# Patient Record
Sex: Female | Born: 1965 | Race: Black or African American | Hispanic: No | Marital: Single | Smoking: Never smoker
Health system: Southern US, Community
[De-identification: ages and names within clinical notes are randomized; demographics above are authoritative.]

## PROBLEM LIST (undated history)

## (undated) DIAGNOSIS — C9200 Acute myeloblastic leukemia, not having achieved remission: Secondary | ICD-10-CM

---

## 1994-09-19 HISTORY — PX: TONSILLECTOMY: SUR1361

## 1998-02-09 ENCOUNTER — Other Ambulatory Visit: Admission: RE | Admit: 1998-02-09 | Discharge: 1998-02-09 | Payer: Self-pay | Admitting: *Deleted

## 1999-08-24 ENCOUNTER — Other Ambulatory Visit: Admission: RE | Admit: 1999-08-24 | Discharge: 1999-08-24 | Payer: Self-pay | Admitting: Family Medicine

## 2001-03-08 ENCOUNTER — Emergency Department (HOSPITAL_COMMUNITY): Admission: EM | Admit: 2001-03-08 | Discharge: 2001-03-09 | Payer: Self-pay | Admitting: *Deleted

## 2001-03-23 ENCOUNTER — Emergency Department (HOSPITAL_COMMUNITY): Admission: EM | Admit: 2001-03-23 | Discharge: 2001-03-23 | Payer: Self-pay | Admitting: Emergency Medicine

## 2001-03-25 ENCOUNTER — Emergency Department (HOSPITAL_COMMUNITY): Admission: EM | Admit: 2001-03-25 | Discharge: 2001-03-25 | Payer: Self-pay | Admitting: Emergency Medicine

## 2003-08-28 ENCOUNTER — Encounter: Admission: RE | Admit: 2003-08-28 | Discharge: 2003-09-25 | Payer: Self-pay | Admitting: Family Medicine

## 2003-08-29 ENCOUNTER — Ambulatory Visit (HOSPITAL_COMMUNITY): Admission: RE | Admit: 2003-08-29 | Discharge: 2003-08-29 | Payer: Self-pay | Admitting: Family Medicine

## 2003-09-20 HISTORY — PX: CHOLECYSTECTOMY: SHX55

## 2003-11-27 ENCOUNTER — Encounter: Admission: RE | Admit: 2003-11-27 | Discharge: 2003-11-27 | Payer: Self-pay | Admitting: General Surgery

## 2003-12-16 ENCOUNTER — Encounter: Admission: RE | Admit: 2003-12-16 | Discharge: 2003-12-16 | Payer: Self-pay | Admitting: General Surgery

## 2003-12-29 ENCOUNTER — Observation Stay (HOSPITAL_COMMUNITY): Admission: RE | Admit: 2003-12-29 | Discharge: 2003-12-30 | Payer: Self-pay | Admitting: General Surgery

## 2003-12-29 ENCOUNTER — Encounter (INDEPENDENT_AMBULATORY_CARE_PROVIDER_SITE_OTHER): Payer: Self-pay | Admitting: Specialist

## 2004-06-10 ENCOUNTER — Encounter: Admission: RE | Admit: 2004-06-10 | Discharge: 2004-06-10 | Payer: Self-pay | Admitting: Occupational Medicine

## 2004-11-09 ENCOUNTER — Emergency Department (HOSPITAL_COMMUNITY): Admission: EM | Admit: 2004-11-09 | Discharge: 2004-11-09 | Payer: Self-pay | Admitting: Family Medicine

## 2004-11-22 ENCOUNTER — Ambulatory Visit (HOSPITAL_COMMUNITY): Admission: RE | Admit: 2004-11-22 | Discharge: 2004-11-22 | Payer: Self-pay | Admitting: Obstetrics & Gynecology

## 2004-12-07 ENCOUNTER — Ambulatory Visit: Payer: Self-pay | Admitting: Gastroenterology

## 2004-12-08 ENCOUNTER — Ambulatory Visit: Payer: Self-pay | Admitting: Internal Medicine

## 2004-12-09 ENCOUNTER — Ambulatory Visit: Payer: Self-pay | Admitting: Hematology & Oncology

## 2004-12-09 ENCOUNTER — Inpatient Hospital Stay (HOSPITAL_COMMUNITY): Admission: AD | Admit: 2004-12-09 | Discharge: 2005-01-11 | Payer: Self-pay | Admitting: Internal Medicine

## 2004-12-10 ENCOUNTER — Encounter (INDEPENDENT_AMBULATORY_CARE_PROVIDER_SITE_OTHER): Payer: Self-pay | Admitting: Specialist

## 2004-12-10 ENCOUNTER — Encounter (INDEPENDENT_AMBULATORY_CARE_PROVIDER_SITE_OTHER): Payer: Self-pay | Admitting: Emergency Medicine

## 2004-12-28 ENCOUNTER — Encounter (INDEPENDENT_AMBULATORY_CARE_PROVIDER_SITE_OTHER): Payer: Self-pay | Admitting: Specialist

## 2005-01-08 ENCOUNTER — Ambulatory Visit: Payer: Self-pay | Admitting: Hematology & Oncology

## 2005-01-24 ENCOUNTER — Ambulatory Visit: Payer: Self-pay | Admitting: Internal Medicine

## 2005-01-31 ENCOUNTER — Encounter (INDEPENDENT_AMBULATORY_CARE_PROVIDER_SITE_OTHER): Payer: Self-pay | Admitting: Specialist

## 2005-01-31 ENCOUNTER — Encounter (INDEPENDENT_AMBULATORY_CARE_PROVIDER_SITE_OTHER): Payer: Self-pay | Admitting: Emergency Medicine

## 2005-01-31 ENCOUNTER — Inpatient Hospital Stay (HOSPITAL_COMMUNITY): Admission: AD | Admit: 2005-01-31 | Discharge: 2005-02-05 | Payer: Self-pay | Admitting: Internal Medicine

## 2005-02-21 ENCOUNTER — Ambulatory Visit: Payer: Self-pay | Admitting: Internal Medicine

## 2005-02-21 ENCOUNTER — Inpatient Hospital Stay (HOSPITAL_COMMUNITY): Admission: EM | Admit: 2005-02-21 | Discharge: 2005-02-25 | Payer: Self-pay | Admitting: Oncology

## 2005-02-21 ENCOUNTER — Encounter (HOSPITAL_COMMUNITY): Admission: RE | Admit: 2005-02-21 | Discharge: 2005-05-22 | Payer: Self-pay | Admitting: Oncology

## 2005-03-11 ENCOUNTER — Ambulatory Visit: Payer: Self-pay | Admitting: Internal Medicine

## 2005-03-14 ENCOUNTER — Inpatient Hospital Stay (HOSPITAL_COMMUNITY): Admission: EM | Admit: 2005-03-14 | Discharge: 2005-03-19 | Payer: Self-pay | Admitting: Internal Medicine

## 2005-03-28 ENCOUNTER — Inpatient Hospital Stay (HOSPITAL_COMMUNITY): Admission: EM | Admit: 2005-03-28 | Discharge: 2005-04-08 | Payer: Self-pay | Admitting: Internal Medicine

## 2005-03-29 ENCOUNTER — Ambulatory Visit: Payer: Self-pay | Admitting: Internal Medicine

## 2005-04-29 ENCOUNTER — Ambulatory Visit: Payer: Self-pay | Admitting: Internal Medicine

## 2005-05-09 ENCOUNTER — Inpatient Hospital Stay (HOSPITAL_COMMUNITY): Admission: RE | Admit: 2005-05-09 | Discharge: 2005-05-14 | Payer: Self-pay | Admitting: Internal Medicine

## 2005-05-10 ENCOUNTER — Ambulatory Visit: Payer: Self-pay | Admitting: Internal Medicine

## 2005-05-19 ENCOUNTER — Inpatient Hospital Stay (HOSPITAL_COMMUNITY): Admission: EM | Admit: 2005-05-19 | Discharge: 2005-05-30 | Payer: Self-pay | Admitting: Internal Medicine

## 2005-06-17 ENCOUNTER — Ambulatory Visit: Payer: Self-pay | Admitting: Internal Medicine

## 2005-06-20 ENCOUNTER — Encounter (HOSPITAL_COMMUNITY): Admission: RE | Admit: 2005-06-20 | Discharge: 2005-09-16 | Payer: Self-pay | Admitting: Internal Medicine

## 2005-06-27 ENCOUNTER — Inpatient Hospital Stay (HOSPITAL_COMMUNITY): Admission: EM | Admit: 2005-06-27 | Discharge: 2005-07-06 | Payer: Self-pay | Admitting: Emergency Medicine

## 2005-06-28 ENCOUNTER — Ambulatory Visit: Payer: Self-pay | Admitting: Internal Medicine

## 2005-07-13 ENCOUNTER — Inpatient Hospital Stay (HOSPITAL_COMMUNITY): Admission: AD | Admit: 2005-07-13 | Discharge: 2005-07-19 | Payer: Self-pay | Admitting: Internal Medicine

## 2005-07-15 ENCOUNTER — Encounter: Payer: Self-pay | Admitting: Internal Medicine

## 2005-07-15 ENCOUNTER — Encounter (INDEPENDENT_AMBULATORY_CARE_PROVIDER_SITE_OTHER): Payer: Self-pay | Admitting: *Deleted

## 2005-07-26 ENCOUNTER — Inpatient Hospital Stay (HOSPITAL_COMMUNITY): Admission: AD | Admit: 2005-07-26 | Discharge: 2005-08-09 | Payer: Self-pay | Admitting: Internal Medicine

## 2005-07-29 ENCOUNTER — Ambulatory Visit: Payer: Self-pay | Admitting: Internal Medicine

## 2005-08-15 ENCOUNTER — Ambulatory Visit: Payer: Self-pay | Admitting: Internal Medicine

## 2005-09-19 DIAGNOSIS — C9200 Acute myeloblastic leukemia, not having achieved remission: Secondary | ICD-10-CM

## 2005-09-19 HISTORY — DX: Acute myeloblastic leukemia, not having achieved remission: C92.00

## 2005-10-21 ENCOUNTER — Ambulatory Visit: Payer: Self-pay | Admitting: Internal Medicine

## 2005-12-19 ENCOUNTER — Ambulatory Visit: Payer: Self-pay | Admitting: Internal Medicine

## 2006-01-23 LAB — CBC WITH DIFFERENTIAL/PLATELET
Basophils Absolute: 0 10*3/uL (ref 0.0–0.1)
Eosinophils Absolute: 0 10*3/uL (ref 0.0–0.5)
HCT: 31.4 % — ABNORMAL LOW (ref 34.8–46.6)
HGB: 10.6 g/dL — ABNORMAL LOW (ref 11.6–15.9)
MONO#: 0.5 10*3/uL (ref 0.1–0.9)
NEUT%: 80.9 % — ABNORMAL HIGH (ref 39.6–76.8)
WBC: 7.6 10*3/uL (ref 3.9–10.0)
lymph#: 1 10*3/uL (ref 0.9–3.3)

## 2006-01-23 LAB — COMPREHENSIVE METABOLIC PANEL
ALT: 21 U/L (ref 0–40)
BUN: 7 mg/dL (ref 6–23)
CO2: 29 mEq/L (ref 19–32)
Calcium: 9.1 mg/dL (ref 8.4–10.5)
Chloride: 99 mEq/L (ref 96–112)
Creatinine, Ser: 0.5 mg/dL (ref 0.4–1.2)
Glucose, Bld: 114 mg/dL — ABNORMAL HIGH (ref 70–99)

## 2006-01-23 LAB — LACTATE DEHYDROGENASE: LDH: 162 U/L (ref 94–250)

## 2006-02-06 ENCOUNTER — Ambulatory Visit: Payer: Self-pay | Admitting: Internal Medicine

## 2006-02-06 LAB — CBC WITH DIFFERENTIAL/PLATELET
HCT: 28.2 % — ABNORMAL LOW (ref 34.8–46.6)
HGB: 9.7 g/dL — ABNORMAL LOW (ref 11.6–15.9)
MCH: 30.5 pg (ref 26.0–34.0)
RDW: 14.2 % (ref 11.3–14.5)

## 2006-02-06 LAB — MANUAL DIFFERENTIAL
ALC: 1.3 10*3/uL (ref 0.9–3.3)
ANC (CHCC manual diff): 3.5 10*3/uL (ref 1.5–6.5)
LYMPH: 19 % (ref 14–49)
MONO: 3 % (ref 0–14)
PLT EST: DECREASED

## 2006-02-07 ENCOUNTER — Ambulatory Visit (HOSPITAL_COMMUNITY): Admission: RE | Admit: 2006-02-07 | Discharge: 2006-02-07 | Payer: Self-pay | Admitting: Obstetrics & Gynecology

## 2006-02-10 ENCOUNTER — Inpatient Hospital Stay (HOSPITAL_COMMUNITY): Admission: RE | Admit: 2006-02-10 | Discharge: 2006-02-11 | Payer: Self-pay | Admitting: Internal Medicine

## 2006-02-10 ENCOUNTER — Ambulatory Visit: Payer: Self-pay | Admitting: Oncology

## 2006-02-10 ENCOUNTER — Encounter: Payer: Self-pay | Admitting: Internal Medicine

## 2006-02-10 ENCOUNTER — Encounter (INDEPENDENT_AMBULATORY_CARE_PROVIDER_SITE_OTHER): Payer: Self-pay | Admitting: *Deleted

## 2006-03-20 LAB — CBC WITH DIFFERENTIAL/PLATELET
BASO%: 0.4 % (ref 0.0–2.0)
HCT: 37.4 % (ref 34.8–46.6)
LYMPH%: 17 % (ref 14.0–48.0)
MCHC: 34.9 g/dL (ref 32.0–36.0)
MCV: 87.6 fL (ref 81.0–101.0)
MONO#: 0.6 10*3/uL (ref 0.1–0.9)
MONO%: 18.9 % — ABNORMAL HIGH (ref 0.0–13.0)
NEUT%: 63.4 % (ref 39.6–76.8)
Platelets: 152 10*3/uL (ref 145–400)
RBC: 4.27 10*6/uL (ref 3.70–5.32)

## 2006-03-20 LAB — COMPREHENSIVE METABOLIC PANEL
ALT: 378 U/L — ABNORMAL HIGH (ref 0–40)
Alkaline Phosphatase: 188 U/L — ABNORMAL HIGH (ref 39–117)
CO2: 25 mEq/L (ref 19–32)
Creatinine, Ser: 0.49 mg/dL (ref 0.40–1.20)
Glucose, Bld: 94 mg/dL (ref 70–99)
Sodium: 141 mEq/L (ref 135–145)
Total Bilirubin: 1 mg/dL (ref 0.3–1.2)
Total Protein: 7.3 g/dL (ref 6.0–8.3)

## 2006-03-23 LAB — COMPREHENSIVE METABOLIC PANEL
Alkaline Phosphatase: 162 U/L — ABNORMAL HIGH (ref 39–117)
CO2: 26 mEq/L (ref 19–32)
Creatinine, Ser: 0.47 mg/dL (ref 0.40–1.20)
Glucose, Bld: 92 mg/dL (ref 70–99)
Total Bilirubin: 0.7 mg/dL (ref 0.3–1.2)

## 2006-03-23 LAB — MAGNESIUM: Magnesium: 1.5 mg/dL (ref 1.5–2.5)

## 2006-03-23 LAB — CBC WITH DIFFERENTIAL/PLATELET
BASO%: 0.2 % (ref 0.0–2.0)
Eosinophils Absolute: 0 10*3/uL (ref 0.0–0.5)
HCT: 36.7 % (ref 34.8–46.6)
LYMPH%: 16.2 % (ref 14.0–48.0)
MCHC: 35.1 g/dL (ref 32.0–36.0)
MCV: 88 fL (ref 81.0–101.0)
MONO#: 0.5 10*3/uL (ref 0.1–0.9)
MONO%: 16.9 % — ABNORMAL HIGH (ref 0.0–13.0)
NEUT%: 66.1 % (ref 39.6–76.8)
Platelets: 135 10*3/uL — ABNORMAL LOW (ref 145–400)
WBC: 3.2 10*3/uL — ABNORMAL LOW (ref 3.9–10.0)

## 2006-05-08 ENCOUNTER — Ambulatory Visit: Payer: Self-pay | Admitting: Internal Medicine

## 2006-05-08 LAB — CBC WITH DIFFERENTIAL/PLATELET
Basophils Absolute: 0 10*3/uL (ref 0.0–0.1)
Eosinophils Absolute: 0 10*3/uL (ref 0.0–0.5)
HCT: 30.3 % — ABNORMAL LOW (ref 34.8–46.6)
HGB: 10.7 g/dL — ABNORMAL LOW (ref 11.6–15.9)
MONO#: 0.5 10*3/uL (ref 0.1–0.9)
NEUT%: 44.5 % (ref 39.6–76.8)
Platelets: 41 10*3/uL — ABNORMAL LOW (ref 145–400)
WBC: 2.2 10*3/uL — ABNORMAL LOW (ref 3.9–10.0)
lymph#: 0.7 10*3/uL — ABNORMAL LOW (ref 0.9–3.3)

## 2006-05-08 LAB — COMPREHENSIVE METABOLIC PANEL
ALT: 568 U/L — ABNORMAL HIGH (ref 0–40)
BUN: 11 mg/dL (ref 6–23)
CO2: 23 mEq/L (ref 19–32)
Calcium: 9.7 mg/dL (ref 8.4–10.5)
Chloride: 102 mEq/L (ref 96–112)
Creatinine, Ser: 0.6 mg/dL (ref 0.40–1.20)
Glucose, Bld: 117 mg/dL — ABNORMAL HIGH (ref 70–99)

## 2006-05-08 LAB — MAGNESIUM: Magnesium: 1.4 mg/dL — ABNORMAL LOW (ref 1.5–2.5)

## 2006-05-11 LAB — COMPREHENSIVE METABOLIC PANEL
ALT: 449 U/L — ABNORMAL HIGH (ref 0–40)
Alkaline Phosphatase: 150 U/L — ABNORMAL HIGH (ref 39–117)
Creatinine, Ser: 0.51 mg/dL (ref 0.40–1.20)
Glucose, Bld: 95 mg/dL (ref 70–99)
Sodium: 137 mEq/L (ref 135–145)
Total Bilirubin: 1.9 mg/dL — ABNORMAL HIGH (ref 0.3–1.2)
Total Protein: 7.8 g/dL (ref 6.0–8.3)

## 2006-05-11 LAB — CBC WITH DIFFERENTIAL/PLATELET
Basophils Absolute: 0 10*3/uL (ref 0.0–0.1)
EOS%: 0 % (ref 0.0–7.0)
HGB: 9.6 g/dL — ABNORMAL LOW (ref 11.6–15.9)
MCH: 29.4 pg (ref 26.0–34.0)
MONO#: 0.5 10*3/uL (ref 0.1–0.9)
NEUT#: 2 10*3/uL (ref 1.5–6.5)
RDW: 14.7 % — ABNORMAL HIGH (ref 11.3–14.5)
WBC: 3.2 10*3/uL — ABNORMAL LOW (ref 3.9–10.0)
lymph#: 0.7 10*3/uL — ABNORMAL LOW (ref 0.9–3.3)

## 2006-05-18 LAB — CBC WITH DIFFERENTIAL/PLATELET
Eosinophils Absolute: 0 10*3/uL (ref 0.0–0.5)
HCT: 24 % — ABNORMAL LOW (ref 34.8–46.6)
LYMPH%: 21.1 % (ref 14.0–48.0)
MCV: 84.3 fL (ref 81.0–101.0)
MONO#: 0.4 10*3/uL (ref 0.1–0.9)
MONO%: 16.4 % — ABNORMAL HIGH (ref 0.0–13.0)
NEUT#: 1.6 10*3/uL (ref 1.5–6.5)
NEUT%: 61.6 % (ref 39.6–76.8)
Platelets: 71 10*3/uL — ABNORMAL LOW (ref 145–400)
RBC: 2.85 10*6/uL — ABNORMAL LOW (ref 3.70–5.32)

## 2006-05-18 LAB — COMPREHENSIVE METABOLIC PANEL
BUN: 7 mg/dL (ref 6–23)
CO2: 24 mEq/L (ref 19–32)
Calcium: 9 mg/dL (ref 8.4–10.5)
Chloride: 101 mEq/L (ref 96–112)
Creatinine, Ser: 0.51 mg/dL (ref 0.40–1.20)
Glucose, Bld: 99 mg/dL (ref 70–99)
Total Bilirubin: 2.3 mg/dL — ABNORMAL HIGH (ref 0.3–1.2)

## 2006-05-18 LAB — TECHNOLOGIST REVIEW: Technologist Review: 2

## 2006-05-23 LAB — CBC WITH DIFFERENTIAL/PLATELET
BASO%: 0.3 % (ref 0.0–2.0)
Eosinophils Absolute: 0 10*3/uL (ref 0.0–0.5)
HCT: 23.1 % — ABNORMAL LOW (ref 34.8–46.6)
HGB: 8.1 g/dL — ABNORMAL LOW (ref 11.6–15.9)
LYMPH%: 14.4 % (ref 14.0–48.0)
MCHC: 35.1 g/dL (ref 32.0–36.0)
MONO#: 0.3 10*3/uL (ref 0.1–0.9)
NEUT#: 2.3 10*3/uL (ref 1.5–6.5)
NEUT%: 73.4 % (ref 39.6–76.8)
Platelets: 81 10*3/uL — ABNORMAL LOW (ref 145–400)
WBC: 3.1 10*3/uL — ABNORMAL LOW (ref 3.9–10.0)
lymph#: 0.4 10*3/uL — ABNORMAL LOW (ref 0.9–3.3)

## 2006-05-25 LAB — CBC WITH DIFFERENTIAL/PLATELET
Eosinophils Absolute: 0.1 10*3/uL (ref 0.0–0.5)
MCV: 87.4 fL (ref 81.0–101.0)
MONO#: 0.5 10*3/uL (ref 0.1–0.9)
MONO%: 12.3 % (ref 0.0–13.0)
NEUT#: 2.6 10*3/uL (ref 1.5–6.5)
RBC: 2.88 10*6/uL — ABNORMAL LOW (ref 3.70–5.32)
RDW: 19.6 % — ABNORMAL HIGH (ref 11.3–14.5)
WBC: 3.8 10*3/uL — ABNORMAL LOW (ref 3.9–10.0)

## 2006-05-25 LAB — COMPREHENSIVE METABOLIC PANEL
ALT: 225 U/L — ABNORMAL HIGH (ref 0–40)
AST: 119 U/L — ABNORMAL HIGH (ref 0–37)
CO2: 23 mEq/L (ref 19–32)
Sodium: 137 mEq/L (ref 135–145)
Total Bilirubin: 1.5 mg/dL — ABNORMAL HIGH (ref 0.3–1.2)
Total Protein: 7.3 g/dL (ref 6.0–8.3)

## 2006-05-25 LAB — HOLD TUBE, BLOOD BANK

## 2006-05-29 ENCOUNTER — Encounter (HOSPITAL_COMMUNITY): Admission: RE | Admit: 2006-05-29 | Discharge: 2006-06-14 | Payer: Self-pay | Admitting: Internal Medicine

## 2006-05-29 LAB — COMPREHENSIVE METABOLIC PANEL
ALT: 117 U/L — ABNORMAL HIGH (ref 0–40)
Albumin: 3.7 g/dL (ref 3.5–5.2)
Alkaline Phosphatase: 89 U/L (ref 39–117)
CO2: 26 mEq/L (ref 19–32)
Glucose, Bld: 107 mg/dL — ABNORMAL HIGH (ref 70–99)
Potassium: 3.2 mEq/L — ABNORMAL LOW (ref 3.5–5.3)
Sodium: 138 mEq/L (ref 135–145)
Total Protein: 6.7 g/dL (ref 6.0–8.3)

## 2006-05-29 LAB — CBC WITH DIFFERENTIAL/PLATELET
BASO%: 0.3 % (ref 0.0–2.0)
EOS%: 1.5 % (ref 0.0–7.0)
LYMPH%: 23 % (ref 14.0–48.0)
MCH: 31.4 pg (ref 26.0–34.0)
MCHC: 34.9 g/dL (ref 32.0–36.0)
MCV: 89.8 fL (ref 81.0–101.0)
MONO#: 0.4 10*3/uL (ref 0.1–0.9)
MONO%: 12.9 % (ref 0.0–13.0)
Platelets: 98 10*3/uL — ABNORMAL LOW (ref 145–400)
RBC: 2.52 10*6/uL — ABNORMAL LOW (ref 3.70–5.32)
WBC: 3 10*3/uL — ABNORMAL LOW (ref 3.9–10.0)

## 2006-05-30 LAB — TYPE & CROSSMATCH - CHCC

## 2006-06-01 LAB — CBC WITH DIFFERENTIAL/PLATELET
BASO%: 0.4 % (ref 0.0–2.0)
EOS%: 1.8 % (ref 0.0–7.0)
HCT: 33 % — ABNORMAL LOW (ref 34.8–46.6)
LYMPH%: 22.9 % (ref 14.0–48.0)
MCH: 31 pg (ref 26.0–34.0)
MCHC: 34.1 g/dL (ref 32.0–36.0)
MONO#: 0.6 10*3/uL (ref 0.1–0.9)
MONO%: 12.1 % (ref 0.0–13.0)
NEUT%: 62.8 % (ref 39.6–76.8)
Platelets: 102 10*3/uL — ABNORMAL LOW (ref 145–400)
RBC: 3.62 10*6/uL — ABNORMAL LOW (ref 3.70–5.32)
WBC: 4.8 10*3/uL (ref 3.9–10.0)

## 2006-06-01 LAB — COMPREHENSIVE METABOLIC PANEL
ALT: 72 U/L — ABNORMAL HIGH (ref 0–40)
AST: 36 U/L (ref 0–37)
Alkaline Phosphatase: 90 U/L (ref 39–117)
CO2: 23 mEq/L (ref 19–32)
Creatinine, Ser: 0.54 mg/dL (ref 0.40–1.20)
Sodium: 137 mEq/L (ref 135–145)
Total Bilirubin: 1.1 mg/dL (ref 0.3–1.2)
Total Protein: 6.7 g/dL (ref 6.0–8.3)

## 2006-07-02 ENCOUNTER — Inpatient Hospital Stay (HOSPITAL_COMMUNITY): Admission: EM | Admit: 2006-07-02 | Discharge: 2006-07-15 | Payer: Self-pay | Admitting: Emergency Medicine

## 2006-07-02 ENCOUNTER — Ambulatory Visit: Payer: Self-pay | Admitting: Emergency Medicine

## 2006-07-03 ENCOUNTER — Ambulatory Visit: Payer: Self-pay | Admitting: Internal Medicine

## 2006-07-09 ENCOUNTER — Encounter (INDEPENDENT_AMBULATORY_CARE_PROVIDER_SITE_OTHER): Payer: Self-pay | Admitting: Specialist

## 2006-12-31 IMAGING — CT CT CHEST W/ CM
1 of 2 series · 14 of 33 positions shown, 18 images · IV contrast (omnipaque)
Comparison: CTs of the chest and abdomen 12/10/2004 and chest radiographs done today.

CLINICAL DATA: Leukemia post chemotherapy.  Right flank pain.  Evaluate for possible pyelonephritis, pneumonia, or appendicitis.  
CHEST CT WITH CONTRAST:
TECHNIQUE: Multidetector CT imaging of the chest was performed following the standard protocol during bolus administration of intravenous contrast.
Contrast:  125 cc Omnipaque 300.  Oral contrast was given.
TECHNIQUE: Multidetector CT imaging of the abdomen was performed following the standard protocol during bolus administration of intravenous contrast.
TECHNIQUE: Multidetector CT imaging of the pelvis was performed following the standard protocol during bolus administration of intravenous contrast.

[Series 2: cap 5.0 b40f st · axial · 0.79mm/px · z∈[-504,+26]mm · 14 of 118 slices shown, 18 images]
[im 6/118  mediastinal]
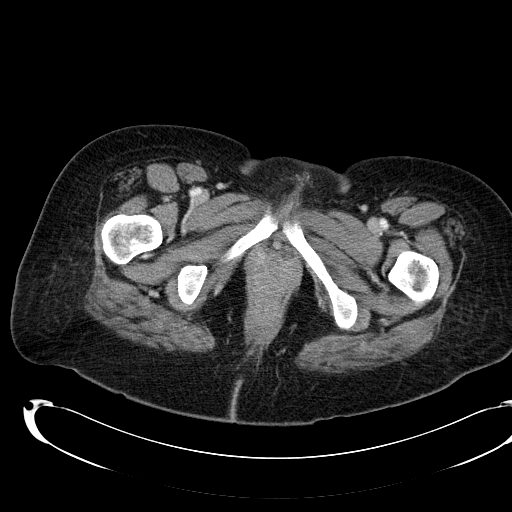
[im 6/118  lung]
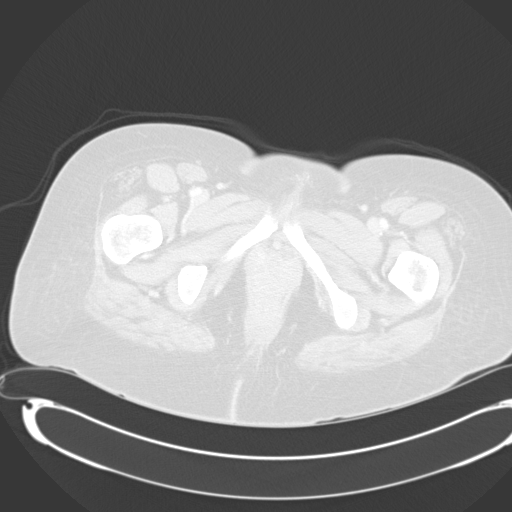
[im 17/118  lung]
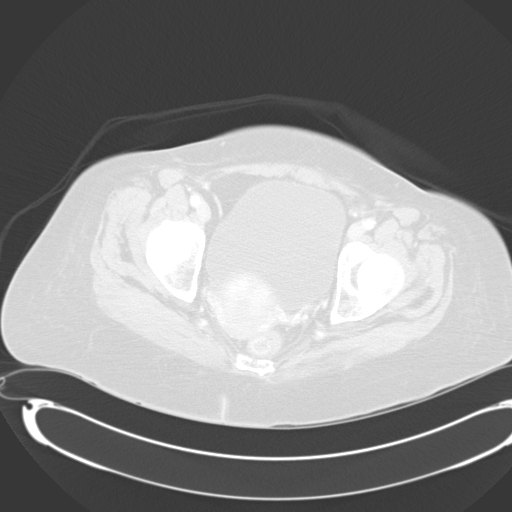
[im 28/118  lung]
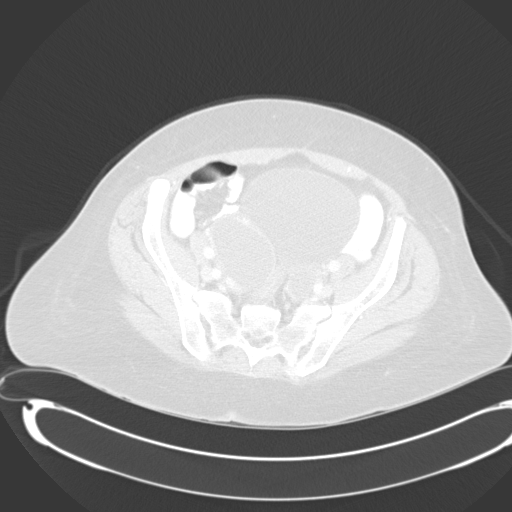
[im 34/118  lung]
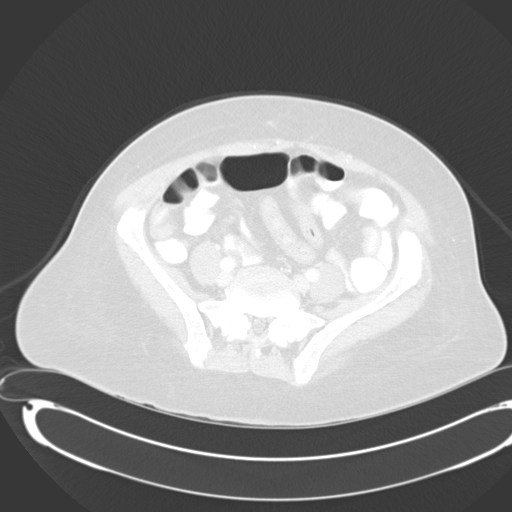
[im 40/118  mediastinal]
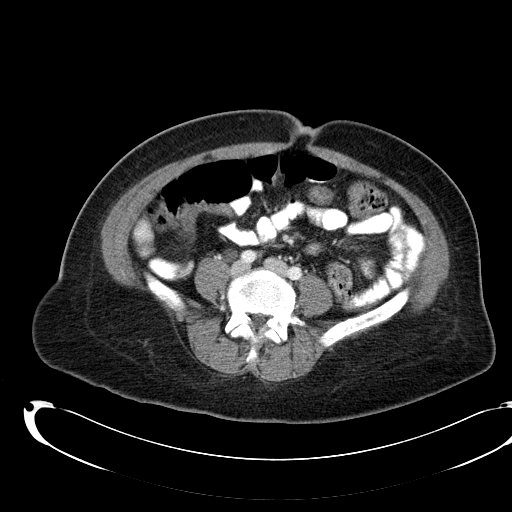
[im 40/118  lung]
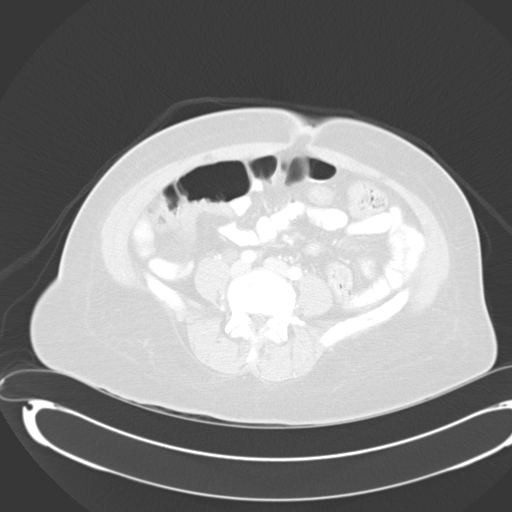
[im 51/118  lung]
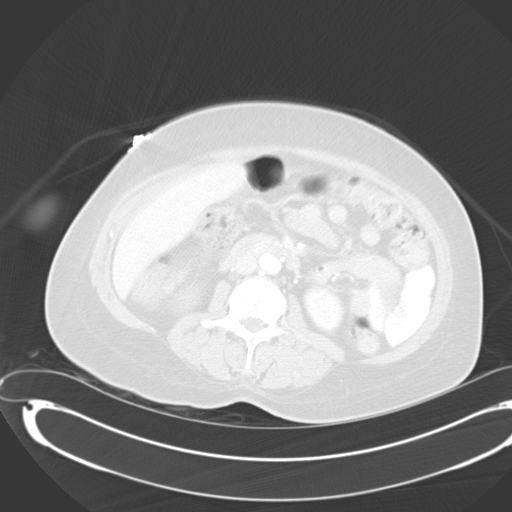
[im 58/118  lung]
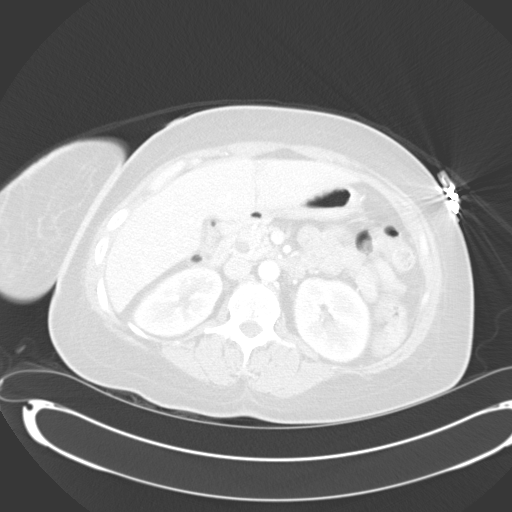
[im 59/118  lung]
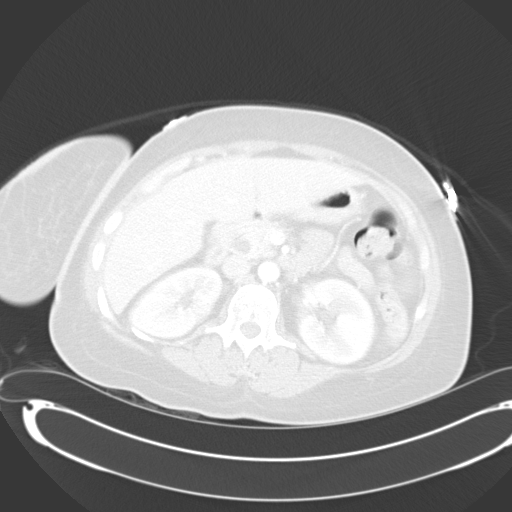
[im 67/118  mediastinal]
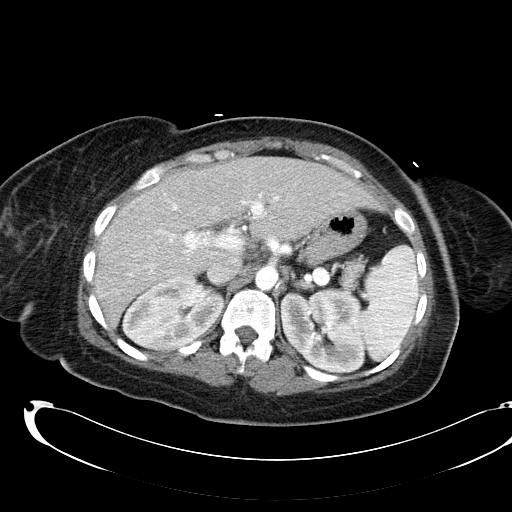
[im 67/118  lung]
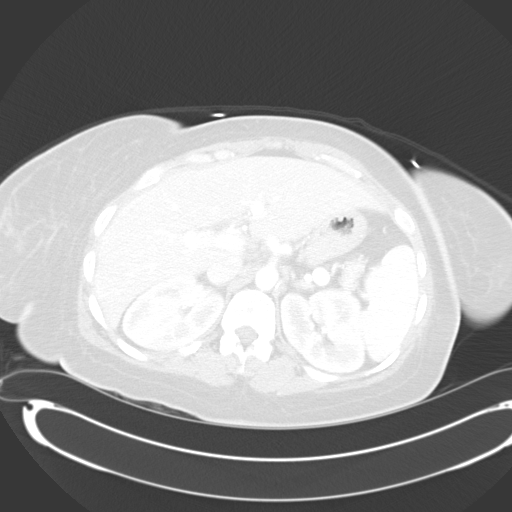
[im 79/118  lung]
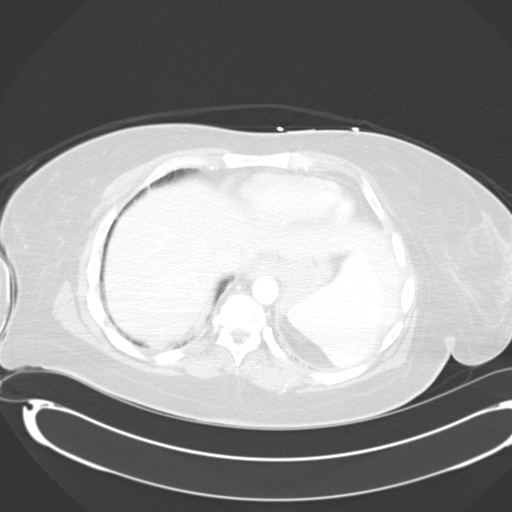
[im 88/118  lung]
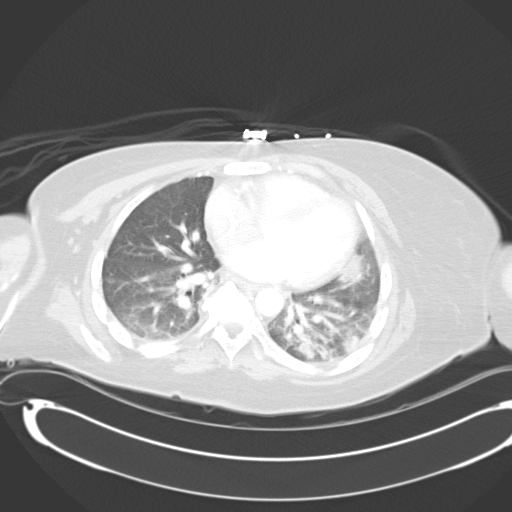
[im 90/118  lung]
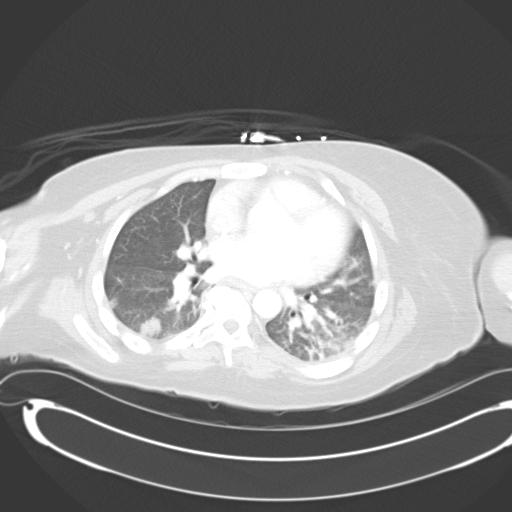
[im 101/118  mediastinal]
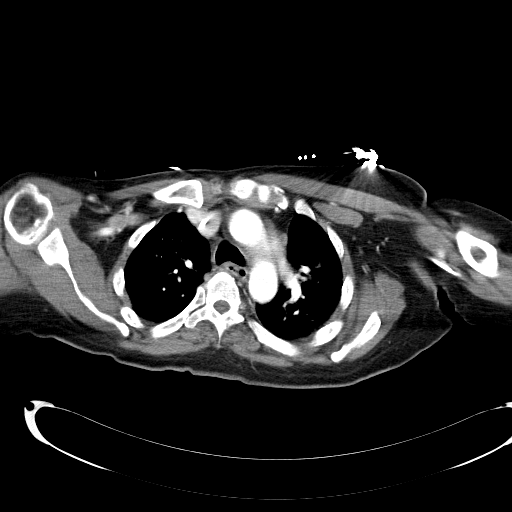
[im 101/118  lung]
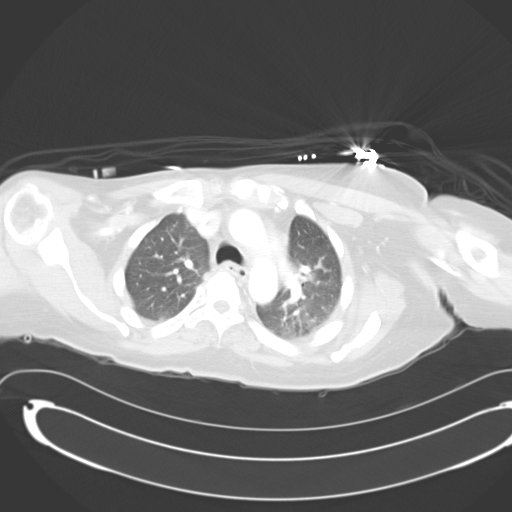
[im 112/118  lung]
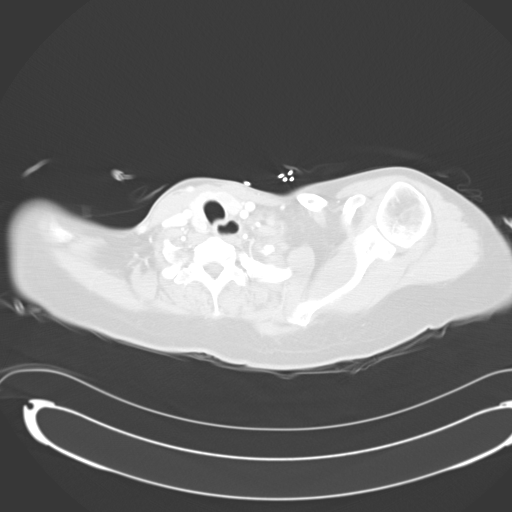

[14 of 33 positions shown; findings below may reference images not displayed]

FINDINGS: There is multifocal airspace disease with nodular components in the left upper lobe on image 14 and in the superior segment of the right lower lobe on image 29.  Additional patchy components are present in both lung bases.  There is no well-defined lung mass or endobronchial lesion.  There is no pleural or pericardial effusion.  No enlarged mediastinal or hilar lymph nodes are seen.  There is no axillary adenopathy.  A PICC line extends into the SVC.  Although not specifically designed to evaluate for pulmonary embolism, there is no evidence for that on this examination.
IMPRESSION: New bilateral airspace opacities with nodular components suspicious for pneumonia.  This could be due to septic emboli.  Drug reaction and leukemic infiltrates are less likely considerations.  Clinical correlation and radiographic followup are recommended.  
ABDOMEN CT WITH CONTRAST:
FINDINGS: The bowel contrast opacification is not optimal.  I do not see any obvious bowel wall thickening or focal inflammatory process in the abdomen.  
The liver has a stable appearance status post cholecystectomy.  There is mild dilatation in the common bile duct.  No pancreatic mass or pancreatic ductal dilatation is seen.  The spleen remains normal in size and demonstrates no focal abnormality.  The adrenal glands and kidneys appear normal.
IMPRESSION: 1.  No CT evidence of pyelonephritis or acute appendicitis.  The appendix is not clearly seen, however.
2.  Mildly progressive biliary dilatation status post cholecystectomy.  This may be physiologic.  Correlation with liver function tests is recommended.  
PELVIS CT WITH CONTRAST:
FINDINGS: In the right adnexa, there is a mildly septated low density lesion measuring 4.0 x 6.2 cm transverse on image 91.  There is a small low density structure in the left adnexa, measuring up to 2.5 cm in diameter.  These are probably cysts of the ovaries.  I do not see a definite dilated fallopian tube or surrounding inflammatory change.  The urinary bladder is distended.  The uterus appears unremarkable.  The appendix is not clearly seen, although no definite signs of appendicitis are demonstrated.  The pelvis was not imaged on the prior examination.
IMPRESSION: 1.  eptated cystic lesions in both adnexa, probably ovarian cysts.  The one on the right could contribute to the patient?s flank pain.  Correlate clinically.  Ultrasound followup is recommended.  
2.  No definite signs of acute inflammation.  The appendix is not clearly seen.

## 2006-12-31 IMAGING — CR DG CHEST 1V PORT
1 series · 1 of 1 positions shown · non-contrast
Comparison: 07/01/06

CLINICAL DATA: 40 year-old with pyelonephritis.  Evaluate PICC line placement.  
PORTABLE CHEST- 1 VIEW:

[view not recorded]
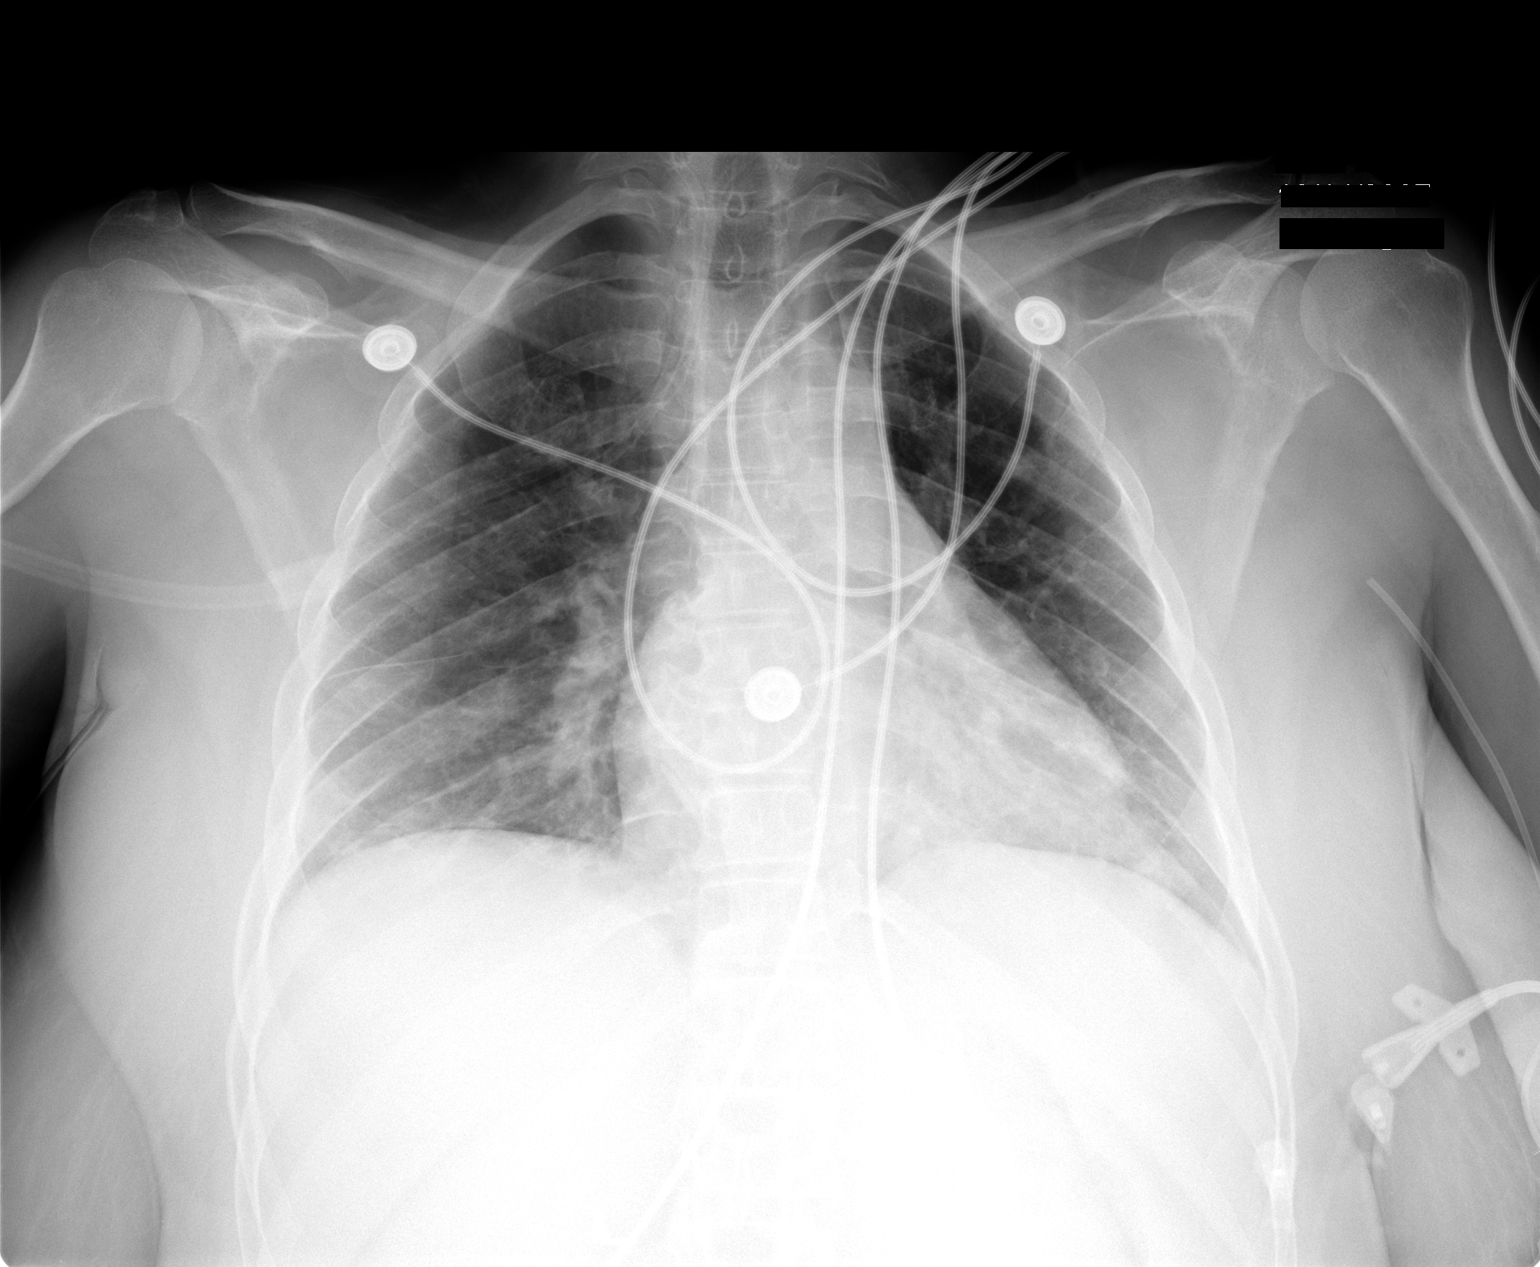

[1 of 1 positions shown; findings below may reference images not displayed]

FINDINGS: Single view of the chest demonstrates that the left PICC line has been pulled back and the tip is in the left arm near the axilla.  Compared to the prior exam, the aeration in the left lung base is slightly improved but there probably is a component of basilar atelectasis and mild thickening in the central airways.  Heart and mediastinum are relatively stable.
IMPRESSION: 1.  The PICC line has been pulled back as described.
2.  Mild central airway thickening with slightly improved aeration in the left lung base.

## 2010-03-15 ENCOUNTER — Ambulatory Visit (HOSPITAL_COMMUNITY): Admission: RE | Admit: 2010-03-15 | Discharge: 2010-03-15 | Payer: Self-pay | Admitting: Obstetrics & Gynecology

## 2011-02-04 NOTE — Discharge Summary (Signed)
NAME:  ROBERTO, HLAVATY NO.:  [id number]   MEDICAL RECORD NO.:  [id number]          PATIENT TYPE:  INP   LOCATION:  1303                         FACILITY:  [facility]   PHYSICIAN:  Lajuana Matte, MD  DATE OF BIRTH:  07-12-1966   DATE OF ADMISSION:  07/13/2005  DATE OF DISCHARGE:  07/19/2005                                 DISCHARGE SUMMARY   REASON FOR ADMISSION:  Ms. 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  She is status post induction chemotherapy with  idarubicin and cytarabine given on December 15, 2004, without evidence of  leukemia on bone marrow biopsy performed on day #14 and day #48.  She is  status post three cycles of consolidation therapy given on Jan 31, 2005,  March 14, 2005 and on May 09, 2005.  She was admitted after cycle #2 and  cycle #3 for severe neutropenia/pancytopenia.   HOSPITAL COURSE:  The patient was admitted to the oncology service.  Planned  procedures of a Hickman catheter placement was obtained on the day of  admission.  On July 15, 2005, the bone marrow biopsy and aspirate were  performed.  The patient tolerated the procedure without any difficulty or  complications.  She received her chemotherapy as scheduled.  During her most  recent admission, she had vancomycin-resistant enterococcus that had been  treated with Zyvox.  During this admission, VRE precautions were put in  place.  There were no further positive cultures for VRE during this  admission.  The patient did, however, become anemic with a hemoglobin of  7.7.  She was transfused 2 units of packed red blood cells.  As stated  above, she completed her fourth cycle of consolidation chemotherapy with  high-dose Ara-C.   CONDITION ON  DISCHARGE:  Good.   DISCHARGE MEDICATIONS:  1.  Protonix 40 mg p.o. daily.  2.  Dexamethasone 0.1% eye drops one drop in each eye x48 hours.  3.  Diflucan 100 mg p.o. daily.  4.  Avelox 400 mg p.o. daily.  5.  Oxycodone 5 mg q.6h. p.r.n. pain.  6.  K-Dur 10 mEq p.o. daily.   DISPOSITION:  Discharged to home.   SPECIAL INSTRUCTIONS:  She will have labs drawn on July 25, 2005, at  [facility].   FOLLOW UP:  Follow up with Dr. Lajuana Matte as scheduled.     ______________________________  [name], PA.      Lajuana Matte, MD  Electronically Signed    AJ/MEDQ  D:  07/19/2005  T:  07/19/2005  Job:  [id number]

## 2011-02-04 NOTE — H&P (Signed)
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 This was found during  workup for abdominal pain. She is now status post induction and cycle #2 of  consolidation chemotherapy. She is seen in the office today after blood work  showed platelets less than 6000 and an H&H revealing anemia. Elizabeth [name] started  bleeding from her gums just before we completed the paperwork for admission  to the hospital. Although she is currently afebrile, she is admitted for  thrombocytopenia, severe neutropenia, and coverage with IV antibiotics,  antivirals, and antiyeast agents will be put into place. Elizabeth [name] has a  history of GERD and depression. Her mother is quite attentive and we will  admit her for platelet transfusion ASAP.   PAST MEDICAL HISTORY:  1.  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  2.  History of depression.  3.  History of peptic ulcer disease.  4.  Status post cholecystectomy.  5.  Status post tonsillectomy and adenoidectomy.   FAMILY HISTORY:  Positive for diabetes mellitus, hypertension, and coronary  artery disease. There is also family history of pancreatic cancer and breast  cancer. She had a brother to die at age 42 of congestive heart failure and  another sister died in her early 9s of a heart attack.   Elizabeth [name] is single, without children. She used to work at the [employer] in [employer]. She denies any history of smoking, alcohol, or  illicit drug abuse.   ALLERGIES:  She has no known drug allergies. She does have an  intolerance to  ASPIRIN.   HOME MEDICATIONS:  Include Protonix and OxyIR.   REVIEW OF SYSTEMS:  She notes that her bleeding started within the last  hour. She has had no nausea or abdominal pain. She has had some weakness. No  notable shortness of breath. No blurred vision, no chest pain. She noticed  no change in her stool including no blood in the stool. No swelling of the  extremities.   PHYSICAL EXAMINATION:  GENERAL:  This is a young African-American female who  appears much younger than her stated age.  HEENT:  Sclerae are anicteric. She does have some deviation of the right  eye. There are no obvious plaque or lesions in the oropharynx. There is  notable oozing of blood. There are petechial lesions in the posterior  oropharynx. The teeth are in severe need of repair.  CHEST:  Clear to auscultation.  CARDIOVASCULAR:  Regular rate and rhythm. No murmur, no gallop.  ABDOMEN:  Soft, nontender, no organomegaly.  EXTREMITIES:  Without cyanosis, clubbing, or edema.  VITAL SIGNS:  Blood pressure  is 131/75, pulse is 136, respirations 20,  temperature was 98.8. Weight was 175 pounds.   ADMISSION LABORATORY DATA:  WBC 0.4, ANC 0.0, hemoglobin 8.4, hematocrit  23.0.   IMPRESSION AND PLAN:  We will go ahead and admit Elizabeth [name] for her  neutropenia. We will also plan to transfuse platelets as well as packed red  blood cells. Will also start hydration with normal saline and make sure that  she is placed on IV antibiotics of cefepime, acyclovir, and Diflucan, as  well as neutropenic precautions. We will plan to follow up with Dr. Arbutus Ped.  The patient was seen and examined by Dr. Arbutus Ped prior to admission.       JB/MEDQ  D:  03/29/2005  T:  03/29/2005  Job:  [id number]

## 2011-02-04 NOTE — H&P (Signed)
NAME:  Elizabeth [name], Elizabeth [name] NO.:  [id number]   MEDICAL RECORD NO.:  [id number]          PATIENT TYPE:  INP   LOCATION:  1306                         [facility]ACILITY:  [facility]   PHYSICIAN:  Lajuana Matte, MD  DATE OF BIRTH:  1966/04/19   DATE OF ADMISSION:  05/09/2005  DATE OF DISCHARGE:                                HISTORY & PHYSICAL   REASON FOR ADMISSION:  45 year old white female with acute myeloid leukemia  admitted for third cycle of consolidation chemotherapy with high dose  cytarabine.   HISTORY OF PRESENT ILLNESS:  Elizabeth [name] is a very pleasant 45 year old  African-American female who was diagnosed with acute myeloid leukemia with  normal cytogenetics in March of 2006.  The patient is status post induction  chemotherapy with idarubicin and cytarabine given on December 15, 2004 with no  evidence of leukemia on bone marrow biopsies performed on day 14 and day 48  after the induction.  The patient is also status post two cycles of  consolidation chemotherapy last dose given on March 14, 2005.  This was  complicated by an episode of neutropenic fever and pancytopenia which was  recently resolved and the patient started to have normal counts.  The  patient was also referred to [facility] and evaluated by Dr.  Greggory Stallion for stem cell transplant after completing the course of the  consolidation chemotherapy.  She already has had HLA typing performed and  her other siblings are in the process of the HLA typing.  She was admitted  today for the third cycle of high dose cytarabine today.  She denied having  any significant complaints except for mild right lower quadrant pain that  resolved spontaneously.  Otherwise, she is feeling fine.   REVIEW OF SYSTEMS:  She has no fever, chills, headache, blurring of vision,  double vision.  No chest pain, shortness of breath, cough or palpitations.  No nausea or vomiting, abdominal pain, diarrhea, constipation, melena or  hematochezia.  No dysuria, hematuria, urgency or increased frequency.   PAST MEDICAL HISTORY:  1.  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  2.  Gastroesophageal reflux disease.  3.  Status post cholecystectomy.  4.  History of depression.  5.  Status post tonsillectomy and adenoidectomy.   FAMILY HISTORY:  Significant for diabetes mellitus, hypertension and  coronary artery disease.  She had a brother who died at age 52 of congestive  heart failure and another sister died in her early 31's with a heart attack.   SOCIAL HISTORY:  She is single. She has no children.  She is currently on  disability.  Denied having any history of smoking, alcohol or drug abuse.   ALLERGIES:  She has intolerance to ASPIRIN.   MEDICATIONS:  Her home medications include only Protonix and oxycodone.   PHYSICAL EXAMINATION:  VITAL SIGNS:  Blood pressure 107/67, pulse 92,  respiratory rate 20, temperature 99.7.  Oxygen saturation is 100% on room  air.  GENERAL APPEARANCE:  Very pleasant 45 year old African-American female  awake, alert and in no acute distress.  HEENT:  Normocephalic, atraumatic.  Clear oropharynx.  NECK:  Examination soft with no lymphadenopathy.  CHEST:  Clear to auscultation.  No wheezes, crackles or dullness to  percussion.  CARDIOVASCULAR:  Normal S1 and S2, regular rate and rhythm.  No murmurs,  rubs or gallops.  ABDOMEN:  Soft, nontender, nondistended, no masses.  EXTREMITIES:  Show no edema.   LABORATORY DATA:  Performed at the [facility] showed white blood  cell count 4.7, hemoglobin 10.0, hematocrit 29.4, platelet count 133,000.  Sodium 140, potassium 3.6, creatinine 0.6, BUN 10.  Glucose 92.  Alkaline  phosphatase 86, AST 18, ALT 30, calcium 9.0, albumin 3.9.   ASSESSMENT:  This is a 45 year old African-American female with history of  acute myeloid leukemia.  She is status post induction two cycles  consolidation chemotherapy with high  dose cytarabine.   PLAN:  I will admit the patient today to [facility] oncology  service for her third cycle of consolidation chemotherapy.  This cycle will  be in the form of cytarabine 2 grams/m2 given every 12 hours on days 1, 3  and 5 of her admission.  The patient also will be started on dexamethasone  0.1% eye drops during the chemotherapy.  She will continue on  gastrointestinal prophylaxis with Protonix 20 mg p.o. daily as well as deep  venous thrombosis prophylaxis with sequential compression devices.  The  patient also will have oxycodone for pain management if needed.      Lajuana Matte, MD  Electronically Signed     MKM/MEDQ  D:  05/10/2005  T:  05/10/2005  Job:  [id number]   cc:   Jaclyn Prime. Greggory Stallion, M.D.  [facility]  [place] [address]  Fax: [phone]

## 2011-02-04 NOTE — Discharge Summary (Signed)
NAME:  Elizabeth [name], Elizabeth [name] NO.:  [id number]   MEDICAL RECORD NO.:  [id number]          PATIENT TYPE:  INP   LOCATION:  0251                         FACILITY:  [facility]   PHYSICIAN:  Lajuana Matte, MD  DATE OF BIRTH:  09-19-1966   DATE OF ADMISSION:  12/09/2004  DATE OF DISCHARGE:  01/11/2005                                 DISCHARGE SUMMARY   CONDITION ON DISCHARGE:  Improved.   DISCHARGE DIAGNOSES:  1.  Acute myelogenous leukemia, day 28 status post induction chemotherapy      with Idarubicin and cytarabine.  2.  Pancytopenia secondary to #1, improved.  3.  Elevated liver function tests, possibly due to liver dysfunction      secondary to chemotherapy versus antibiotics, to be monitored as an      outpatient.  4.  Headache, resolved.  5.  Ejection fraction 58%.   MEDICATIONS ON DISCHARGE:  1.  Oxycodone 5 mg p.o. q.4h. p.r.n. pain.  2.  Compazine 10 mg p.o. q.6h. p.r.n. nausea and vomiting.  3.  Protonix 40 mg p.o. daily.   LABORATORY ON DISCHARGE:  Hemoglobin 10.2, hematocrit 29.8, white count 3.1,  platelet count 132,000. ANC 1000. Sodium 137, potassium 3.7, BUN 5,  creatinine 0.4, glucose 117, alkaline phosphatase 270, AST 84, ALT 134. CMV  negative.   PROCEDURES:  1.  Status post neck CT with contrast/chest CT with contrast/abdominal CT      with contrast, all on December 11, 2003.  2.  Status post cardiac MUGA, December 10, 2004.  3.  Status post Hickman catheter placement, December 14, 2004; interventional      radiology.  4.  Status post abdominal ultrasound on January 10, 2005.  5.  CT of the head on December 26, 2004.  6.  Status post bone marrow biopsy, January 10, 2005.  7.  Status post transfusion of platelets for a platelet count of 13,000 on      January 04, 2005.  8.  Status post transfusion of two units of red blood cells on December 31, 2004.  9.  Status post transfusion of two units of packed RBCs on December 27, 2004,      for a hemoglobin of 8.4.  10.  Status post transfusion of platelets for a platelet count of 12,000 on      December 26, 2004.  11. Status post transfusion of two units of packed RBCs on December 23, 2004,      for a hemoglobin of 8.  12. Status post transfusion of platelets for a platelet count of 10,000 on      December 23, 2004.  13. Status post transfusion of platelets on December 19, 2004, for a platelet      count of 10,000.  14. Status post transfusion of two units of packed RBCs on December 18, 2004,      for the hemoglobin of 8.4.  15. Status post transfusion of two units of packed RBCs, on December 09, 2004,      for a hemoglobin of 5.7.  16.  Status post venous Doppler evaluation of the right lower extremity, with      no evidence of DVT.   HISTORY OF PRESENT ILLNESS:  Elizabeth [name] is a pleasant 45 year old African  American female initially seen at the [facility] on outpatient  basis by Dr. Arbutus Ped for evaluation of leukocytosis, anemia, and  thrombocytopenia. These were highly suspicious for Acute Leukemia. Upon  evaluation Dr. Arbutus Ped admitted the patient to [facility] (the  oncology service), started her on IV hydration, supplemented with sodium  bicarbonate for alkalinization of her urine.  She also was started on  allopurinol 300 mg daily and neutropenia. Diet and precautions were  initiated along with culture. These therapies were initiated due to CBC  performed on December 09, 2004, at the [facility], showing a total  white blood count of 34,000 with 88% blasts and Auer rods . Her hemoglobin  was 6.0, hematocrit 18.4, platelet count 20,000. Also on admission she was  started empirically on antibiotics with cefepime and once admitted, she  received transfusions of two units of packed red blood cells, along with GI  prophylaxis. On December 10, 2004, the patient underwent a bone marrow biopsy  and aspirate sent for flow cytometryand cytogenetics, which indeed was  consistent with acute myeloid  leukemia with cell population displaying a  medium staining for CD-45 corresponding to blasts, with a major blastic  population estimated at 89%. These expressed HLADR, CD-13, CD-11C, CD-33, CD-  15, CD-117, and CD-34 consistent with myeloid phenotype. In addition, there  is aberrant expression of CD-7. Continuous supportive care was given to the  patient consisting of IV antibiotics and transfusion of packed red blood  cells along with platelets. The patient developed a significant headache,  which required evaluation for CT. This was negative for any type of  metastasis, hemorrhage, or any other abnormality. This headache continued  through most of her hospitalization, and was well managed with medications.  Cytomegalovirus test was negative. While in the hospital, it was decided  that she receive chemotherapy for the treatment of her AML. On December 15, 2004, with premedications with Zofran and Decadron, chemotherapy consisting  of idarubicin 12 mg per m2 equal to 21 mg IV bolus on days one, two, and  three along with cytarabine 100 mg/m2/24 hours IV continuous infusion from  day one through day seven, (from December 15, 2004, through December 21, 2004).  Overall, the patient tolerated his chemotherapy well, with minor side  effects including mild nausea, well managed with antiemetics. In addition,  on April 11th, the patient developed a fever no greater than 100.6, closely  monitored. However, this temperature rose to 101.4 on December 29, 2004,  despite blood cultures being negative and IV antibiotics and antivirals  consisting of cefepime, vancomycin, Vfend and acyclovir as well as  gentamycin were administered. This fever was believed to be multifactorial.  She has been pancytopenic. Status post Port-A-Cath placement. This fever  eventually resolved, and gradually all of her IV antibiotics discontinued as  her neutrophil count improved. As of January 11, 2005, the patient has no complaints, her  vital signs are stable, and she is afebrile with a  temperature of 98.4. Her saturation is normal at 99% on room air.  Her labs  are stable, and her physical exam is essentially unremarkable. After  evaluation by Dr. Arbutus Ped and discussion with the patient, it was decided  that she is stable to be discharged from [facility]. She  is day 28  status post induction chemotherapy with Malachi Bonds and ara-C.   The patient will have her Hickman's catheter flushed at the [facility] on Wednesday, April 26th at 10 in the morning; Friday, April 28th at  12 p.m.; Monday, May1st at 12:15 p.m.; Wednesday, May 3rd at 2 p.m.; Friday,  May 5th at 11:30 a.m.; May 8th at 12 p.m.   She will also follow up with Dr. Arbutus Ped in two weeks with labs at the  [facility] in one week.      SW/MEDQ  D:  01/11/2005  T:  01/11/2005  Job:  [id number]

## 2011-02-04 NOTE — H&P (Signed)
NAME:  Elizabeth [name], Elizabeth [name] NO.:  [id number]   MEDICAL RECORD NO.:  [id number]          PATIENT TYPE:  INP   LOCATION:  1320                         FACILITY:  [facility]   PHYSICIAN:  Lajuana Matte, MD  DATE OF BIRTH:  16-Oct-1965   DATE OF ADMISSION:  03/14/2005  DATE OF DISCHARGE:                                HISTORY & PHYSICAL   REASON FOR ADMISSION:  A 45 year old African-American female with a history  of AML.  Admitted for second cycle of consolidation chemotherapy with high-  dose cytarabine.   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  The  patient is status post induction chemotherapy with idarubicin and cytarabine  with repeat bone marrows on day 14 as well as day 48 showing no evidence of  leukemia.  The patient received her first cycle of consolidation  chemotherapy with high dose cytarabine on Jan 31, 2005.  She tolerated the  treatment well, but unfortunately, she was recently admitted to [facility]  on February 25, 2005 after she presented with significant anemia and  neutropenia.  She was treated for neutropenic fever, received several  transfusions during her admission, and improved significantly.  She was seen  in the clinic twice since her discharge, and she was doing fine except for  questionable abscess in her right leg that was drained by general surgery.  The patient was treated with Augmentin during this.  She came today to the  [facility] for evaluation for admission.  She was doing fine.  She will be admitted today to receive her second cycle of consolidation  chemotherapy with high-dose ARA-C.  She denied having any significant  complaints.   REVIEW OF SYSTEMS:  No fever, chills, headache, blurring of vision, double  vision, chest pain, shortness of breath, cough or palpitations.  No nausea  or vomiting, abdominal pain,  constipation, diarrhea, melena, or  hematochezia.  No dysuria, hematuria, urgency, or increased frequency.  No  musculoskeletal or neuroskeletal abnormalities.   PAST MEDICAL HISTORY:  1.  Acute myelogenous leukemia, diagnosed in March, 2006.  2.  History of depression.  3.  Peptic ulcer disease.  4.  Status post cholecystectomy.  5.  Status post tonsillectomy and adenoidectomy.   FAMILY HISTORY:  Diabetes mellitus, hypertension, and coronary artery  disease.  There is also a family history of pancreatic cancer and breast  cancer.  Her brother died at the age of 2 of congestive heart failure.  Also, another sister died in her early 43s with a heart attack.   SOCIAL HISTORY:  She is single.  No children.  She used to work at [employer] at the [employer].  Denied having any history of  smoking, alcohol, or drug abuse.   ALLERGIES:  No known drug allergies.  She has intolerance to ASPIRIN.   HOME MEDICATIONS:  Protonix and OxyIR.   PHYSICAL EXAMINATION:  VITAL SIGNS:  Blood pressure 98/57, pulse 86,  respiratory rate 20, temperature 98.6.  Oxygen sat 100% on room air.  GENERAL:  A very pleasant 45 year old African-American female who is awake  and alert in no acute distress.  HEENT:  Normocephalic and atraumatic.  Clear oropharynx.  NECK:  Supple.  No lymphadenopathy.  LUNGS:  Clear to auscultation.  No wheezes, crackles, or dullness to  percussion.  CARDIOVASCULAR:  Normal S1 and S2.  Regular rate and rhythm.  No murmurs,  rubs or gallops.  ABDOMEN:  Soft, nontender, nondistended.  No masses.  EXTREMITIES:  No edema.   LABORATORY DATA:  CBC and comprehensive metabolic panel performed at the  [facility].  It showed white blood count of 4.5, platelets 126,  hemoglobin 10.8, hematocrit 31.3.  Sodium 141, potassium 3.2, LDH 170,  glucose 91, calcium 9.3, BUN 10, creatinine 0.5.   ASSESSMENT/PLAN:  This is a 45 year old African-American female with a   history of acute myelogenous leukemia, status post induction and first cycle  of consolidation chemotherapy with high-dose cytarabine.  Patient will be  admitted today for the second cycle of consolidation chemotherapy.  She will  be started at a dose of 3 gm/m2 to be given every 12 hours on days 1, 3, and  5 per admission.  The patient will be monitored closely for any neurological  abnormalities.  She will also be started on dexamethasone 0.1% eye drops  during her treatment with cytarabine.  Patient will be monitored closely  during this admission for any abnormalities in her blood count or  electrolytes.  For hyperkalemia, she will have replacement of potassium with  potassium chloride infusion as needed.  In the meantime, the patient will  continue on her home medication with Protonix and OxyIR for pain.  She will  have DVT prophylaxis with sequential compression devices during this  admission.       MKM/MEDQ  D:  03/14/2005  T:  03/14/2005  Job:  [id number]   cc:   Barbette Hair. Arlyce Dice, M.D. [facility]

## 2011-02-04 NOTE — Op Note (Signed)
NAME:  Elizabeth [name], Elizabeth [name] NO.:  [id number]   MEDICAL RECORD NO.:  [id number]          PATIENT TYPE:  INP   LOCATION:  1310                         FACILITY:  [facility]   PHYSICIAN:  Vikki Ports, MDDATE OF BIRTH:  06/08/1966   DATE OF PROCEDURE:  07/01/2005  DATE OF DISCHARGE:                                 OPERATIVE REPORT   PREOPERATIVE DIAGNOSIS:  AML and probable infected Hickman catheter.   POSTOPERATIVE DIAGNOSIS:  AML and probable infected Hickman catheter.   PROCEDURE:  Removal of Hickman.   DESCRIPTION:  At the bedside, the exiting area of the Hickman catheter was  cleansed with Benzoin, and using constant pressure on the catheter, it was  removed. I held pressure in the subclavian region for about five minutes.  Dressing was applied. The patient tolerated the procedure.      Vikki Ports, MD  Electronically Signed     KRH/MEDQ  D:  07/01/2005  T:  07/01/2005  Job:  [id number]

## 2011-02-04 NOTE — H&P (Signed)
NAME:  Elizabeth [name], Elizabeth [name] NO.:  [id number]   MEDICAL RECORD NO.:  [id number]          PATIENT TYPE:  INP   LOCATION:  0159                         FACILITY:  [facility]   PHYSICIAN:  Kela Millin, M.D.DATE OF BIRTH:  Jan 21, 1966   DATE OF ADMISSION:  07/01/2006  DATE OF DISCHARGE:                                HISTORY & PHYSICAL   PRIMARY CARE PHYSICIAN:  Unassigned.   CHIEF COMPLAINT:  Abdominal pain with nausea, vomiting.   HISTORY OF PRESENT ILLNESS:  The patient is a 45 year old black female with  a past medical history significant for AML with relapse in May, 2007, gram  positive bacteremia/Hickman - status post removal, who presents with above  complaints.  She states that her last chemo was in July/August 2007 at  [facility], where she is followed.  Ms. Elizabeth [name] reports that in the past 5 days  she has had a cough productive of clear phlegm and also nausea and vomiting  and they called her physician at [facility] and a prescription for Ativan as  well as Tussin DM was called in.  Patient states that she also was having  some mid abdominal pain intermittently for the past 5 days.  Her symptoms  continued to worse and today she also had 3 loose stools and subjective  fevers and so she came to the emergency room.  She describes the abdominal  pain as sharp/aching, intermittent, about 30 minutes at a time.  She denies  dysuria, hematemesis, melena, no hematochezia.   The patient was seen in the ER and was noted to be hypotensive, lowest blood  pressure 90/61 and also febrile with a T-max of 103.4.  Electrolyte  abnormalities were also noted and she is admitted to the [facility] for further evaluation and management.   PAST MEDICAL HISTORY:  1. as above.  2. History of depression.  3. History of GERD.   PAST SURGICAL HISTORY:  1. Status post cholecystectomy.  2. Status post tonsillectomy.  3. Adenoidectomy.   MEDICATIONS:  1. Tussin DM 1-2  teaspoons p.r.n.  2. Lorazepam 1 mg p.o. q.4-6 hours p.r.n.  3. Magnesium chloride.  4. Potassium chloride.  5. Oxycodone p.r.n.   ALLERGIES INTOLERANCES:  ASPIRIN CAUSES BLEEDING.   SOCIAL HISTORY:  She denies tobacco, also denies alcohol.   FAMILY HISTORY:  Positive for:  1. Hypertension.  2. Coronary artery disease.  3. CHF.   REVIEW OF SYSTEMS:  As per HPI.  Other review of systems negative.   PHYSICAL EXAM:  GENERAL:  The patient is a middle-aged black female, she is  alert and oriented, in no apparent distress.  T-max 103.4, T-current 100,  blood pressure 90/61, pulse 116, initially 153, and respiratory rate 18, O2  sat 100%.  HEENT:  PERRL, EOMI, dry mucous membranes, no oral exudates.  NECK:  Supple, no adenopathy, no thyromegaly.  LUNGS:  A few left lower lung field crackles, otherwise clear to  auscultation, no wheezes.  CARDIOVASCULAR:  Tachycardic, normal S1, S2, regular rhythm.  ABDOMEN:  Soft, bowel sounds present, mild mid abdominal tenderness,  no  rebound, no organomegaly and no masses palpable.  EXTREMITIES:  No cyanosis and no edema.  NEURO:  Alert and oriented.  Cranial nerves II-XII grossly intact, nonfocal  exam.   LABORATORY DATA:  Chest x-ray - Ill-defined airspace disease left lower lobe  or lingula, worrisome for pneumonia.  Her white cell count is 11.5,  hemoglobin is 10.8, hematocrit 31.5, platelet count is 51, neutrophil count  is 92%.  Sodium is 129, potassium 2, chloride is 75, CO2 is 39, glucose 119,  BUN 4, creatinine 0.4 and her calcium is 8.9.  AST is 35, ALT 21, alkaline  phosphatase is 140.  Total bilirubin is 1.9, lipase is 20.  Leukocyte  esterase is large and urine WBCs 21-50.  Urine nitrite is positive,  bacterial is rare.   ASSESSMENT AND PLAN:  1. Urinary tract infection - Obtain urine cultures, empiric antibiotics.  2. Left lower lobe pneumonia - Blood cultures, empiric antibiotics.      Patient also has a peripherally inserted  central catheter line since      May.  Will obtain cultures and add vancomycin and follow.  Blood      cultures.  3. Sepsis syndrome - Secondary to #1 and 2, follow cultures, empiric      antibiotics as above.  4. Hypokalemia - Replace potassium.  Also check magnesium level.  5. Volume depletion - Hydrate and recheck.  6. Hyponatremia - Likely secondary to volume depletion, will obtain urine      electrolytes, also check a TSH,      hydrate and follow.  7. Thrombocytopenia - Possibly secondary to infection, follow and recheck.  8. History of acute myeloid leukemia with relapse in May of 2007, followed      at [facility] by Dr. Greggory Stallion.      Kela Millin, M.D.  Electronically Signed     ACV/MEDQ  D:  07/02/2006  T:  07/02/2006  Job:  [id number]   cc:   Roseanna Rainbow, M.D.  Fax: [phone]   Jaclyn Prime. Greggory Stallion, M.D.  Fax: [phone]

## 2011-02-04 NOTE — H&P (Signed)
NAME:  Elizabeth [name], WALDER NO.:  [id number]   MEDICAL RECORD NO.:  [id number]          PATIENT TYPE:  INP   LOCATION:  1320                           FACILITY:   PHYSICIAN:  Lajuana Matte, MD  DATE OF BIRTH:  [birth year]   DATE OF ADMISSION:  05/19/2005  DATE OF DISCHARGE:                                HISTORY & PHYSICAL   REASON FOR ADMISSION:  Neutropenia/pancytopenia.   HISTORY OF PRESENT ILLNESS:  Elizabeth [name] is a 45 year old African-American  female diagnosed with acute myeloid leukemia in March of 2006 with normal  cytogenetics.  She is status post induction chemotherapy with idarubicin and  cytarabine chemotherapy given on December 15, 2004 without evidence of leukemia  on bone marrow biopsy performed on day #14 and day #48.  She is status post  3 cycles of consolidation chemotherapy with high dose ara-C first cycle on  Jan 31, 2005.  Cycle #2 on March 14, 2005 and cycle #3 on May 09, 2005.  She was admitted after cycle #2 on March 29, 2005 for severe neutropenia.  She presented today to have follow-up labs drawn and was seen in the clinic  for further evaluation.   REVIEW OF SYSTEMS:  The patient denies fevers or chills, headache, double or  blurred vision.  She denies shortness of breath, chest pain, cough or sputum  production.  She has not had any nausea or vomiting, constipation, diarrhea,  melena or hematochezia.  She denies dysuria, urgency or increased frequency.  She does have some right side pain that rates a 7 or 8 on a 0-10 scale.  She  voices no other musculoskeletal or neuroskeletal complaints or abnormality.   FAMILY HISTORY:  Significant for diabetes mellitus, hypertension, and  coronary artery disease, pancreatic and breast cancer.  Her brother died at  the age of 81 with CHF and a sister died in her early 15s from a heart  attack.   SOCIAL HISTORY:  She is single with no children.  She previously worked at  the [employer] in [employer].  She denies tobacco, alcohol or  drug abuse.   ALLERGIES:  The patient is intolerant to ASPIRIN.  There are no other known  drug allergies.   HOME MEDICATIONS:  1.  Protonix 40 mg p.o. daily.  2.  Oxy-IR 5 mg p.o. every 4-6 hours as needed for pain.   PHYSICAL EXAMINATION:  VITAL SIGNS:  Reveal a temperature of 98.7, pulse  133, respirations 20, blood pressure 126/89.  GENERAL:  This is a pleasant, 45 year old, African-American female who is  awake, alert, and oriented.  HEENT:  Normocephalic, atraumatic.  Oropharynx is clear.  NECK:  Supple without lymphadenopathy.  LUNGS:  Are clear to auscultation and percussion without wheezing, rales or  rhonchi or dullness to percussion.  CARDIOVASCULAR:  Exam reveals a normal S1 and S2 with a tachycardic rhythm  at 133 beats per minute.  There are no appreciable murmurs, gallops or rubs  noted.  ABDOMEN:  Is soft, nontender, nondistended without masses.  EXTREMITIES:  Are nontender without edema.  LABORATORY DATA:  CBC reveals a white count of 0.6, ANC of 0.5, hemoglobin  9.4, hematocrit 26.7, platelets of 19.   ASSESSMENT/PLAN:  This is a 45 year old African-American female with acute  myelocytic anemia, status post third cycle of consolidation chemotherapy  with high dose ara-C.  Of note, her third cycle dose was reduced to 2 g per  meters squared.  She will be admitted for further management of her  neutropenia/pancytopenia including following neutropenic precautions.  The  patient was seen and examined by Dr. Arbutus Ped prior to admission.   [name], P.A.-C. dictating for Velora Heckler. Arbutus Ped, M.D.      Lajuana Matte, MD  Electronically Signed     MKM/MEDQ  D:  05/19/2005  T:  05/19/2005  Job:  [id number]

## 2011-02-04 NOTE — H&P (Signed)
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   HISTORY:  Elizabeth [name] is a 45 year old African-American female whose past  medical history is significant for peptic ulcer disease and depression who  was seen by her gynecologist early in March 2006 complaining of several  weeks duration of abdominal pain. It was located at the periumbilical region  with no radiation, each episode lasting for around 30 minutes, and the  patient rated her pain as 7 on 1-10 scale. Her pain was getting worse  recently and it was associated with nausea and vomiting as well as bloody  stool. The patient because increasingly tired, fatigued, with generalized  weakness. Over the last few days she started having fever, chills, chest  congestion, as well as cough, and nausea and vomiting. The patient was  referred to Dr. Arlyce Dice who saw her 2 days ago for gastrointestinal  evaluation and consideration for upper endoscopy. He ordered CBC and  comprehensive metabolic panel which was performed on December 07, 2004 and  showed a white blood count of 44,300; platelet count of 45,000. She was also  found to have a low hemoglobin and hematocrit of 6.6/20.2 respectively. Her  potassium was also low at 3.0. The patient was referred to me today for  further evaluation and management of her white blood count abnormalities.  When see in the clinic today she was very fatigued, tired, weak, drowsy. She  had a repeat CBC done today at the [facility] and it showed  total white blood count  of 34,000 with 88% blasts and Auer rods. Her  hemoglobin was 6.0; hematocrit 18.4; platelets 20,000. The patient denied  having any other significant complaint except as mentioned above, and she  also had some mild earache.   REVIEW OF SYSTEMS TODAY:  She continued to have low-grade fever with chills,  earache. No headache, blurring of vision, or double vision. She had mild  nausea, no vomiting. Continued to have the abdominal pain, no diarrhea or  constipation. She also mentioned the history of bloody stool. No chest pain,  shortness of breath. Continued to have cough. No hemoptysis. No dysuria,  hematuria, urgency, or increased frequency. No musculoskeletal or neurologic  abnormalities.   PAST MEDICAL HISTORY:  Significant for:  1.  Depression.  2.  Peptic ulcer disease.  3.  Status post cholecystectomy.  4.  Status post tonsillectomy and adenoidectomy.   FAMILY HISTORY:  Significant for diabetes mellitus, hypertension, and  coronary artery disease. There is also family history of pancreatic cancer  and breast cancer. Her brother died at  age 63 of congestive heart failure.  Sister died also at her early 33s with heart attack.   SOCIAL HISTORY:  She is single, has no children. She works at [employer]. She denied having any history of smoking,  alcohol, or drug abuse. She finished high school.   ALLERGIES:  No known drug allergies.   HOME MEDICATION:  Tylenol as needed.   PHYSICAL EXAMINATION TODAY:  VITAL SIGNS:  Her blood pressure was 123/77,  pulse 141, respiratory rate 20, temperature 100.4, weight 158 pounds.  GENERAL:  Showed a 45 year old African-American female, awake, alert, no  acute distress.  HEENT:  Normocephalic, atraumatic. Clear oropharynx.  NECK:  Indurated bilaterally, nontender to palpation.  CHEST:  Clear to auscultation bilaterally.  CARDIOVASCULAR:  Tachycardic, normal S1, S2. No murmur or gallops.  ABDOMEN:  Soft, slightly  tender at the epigastric area, no masses.  EXTREMITIES:  Shows no edema.   LABORATORY DATA:  White blood count 34.0, absolute neutrophil count 1700,  hemoglobin 6.0, hematocrit 18.4, platelets 20, blasts 88% and it showed Auer  rods.   ASSESSMENT AND PLAN:  This is a 45 year old African-American female with  questionable acute leukemia, presenting with leukocytosis, anemia, and  thrombocytopenia. At this point, I will admit the patient to [facility] to the oncology service, start her on IV hydration with D5-W  supplemented with sodium bicarbonate for alkalinization of her urine. The  patient will be started on allopurinol 300 mg p.o. daily. I will start the  patient on neutropenic diet and precautions, check blood culture x2,  urinalysis and culture and sensitivity. I will check CT scan of the chest  and abdomen to rule out any other abnormalities. I will arrange for the  patient to have bone marrow biopsy and aspirate performed tomorrow morning.  The patient will be started empirically on antibiotic with cefepime 2 g IV  q.12h. I will check CBC and comprehensive metabolic panel daily. Will check  LDL, uric acid, CMV, IgG, and IgM titers today. Check DIC panel today. The  patient will be transfused with 2 units of packed red blood cells, filtered,  irradiated, CMV negative/safe. She will also be started on GI prophylaxis  with Protonix 40 mg p.o. q.12h. If the diagnosis confirms the presence of  acute leukemia, the patient will be started on chemotherapy. The patient  will be evaluated closely on a daily basis and further recommendation will  be given depending on her condition.      MKM/MEDQ  D:  12/09/2004  T:  12/09/2004  Job:  [id number]   cc:   Barbette Hair. Arlyce Dice, M.D. [facility]   Roseanna Rainbow, M.D.

## 2011-02-04 NOTE — Consult Note (Signed)
NAME:  ARENA, [name] NO.:  [id number]   MEDICAL RECORD NO.:  [id number]          PATIENT TYPE:  INP   LOCATION:  0159                         FACILITY:  [facility]   PHYSICIAN:  Leslye Peer, MD    DATE OF BIRTH:  07/14/1966   DATE OF CONSULTATION:  07/08/2006  DATE OF DISCHARGE:                                   CONSULTATION   REFERRING PHYSICIAN:  Pierce Crane, M.D.   REASON FOR CONSULTATION:  Asked by Dr. Donnie Coffin with hematology-oncology to  evaluate Elizabeth [name] for pulmonary infiltrates on CT scan and persistent  hypotension from septic shock.   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  She required repeat chemotherapy at [facility] with remission achieved in July of 2007.  She was admitted on  July 02, 2006 with fever, hypotension and SIRS.  Her evaluation revealed  an E. coli bacteremia and urinary tract infection as well as bilateral  nodular lung opacities.  She was covered with antibiotics for her urinary  tract infection and also has had broadened antimicrobials given her nodular  pulmonary disease.  She was initially treated with vancomycin which was  discontinued.  Her antibiotics have included Piperacillin, tazobactam and  gentamicin.  Since admission, she has had progressive hypotension requiring  dopamine and voriconazole was added on July 05, 2006.  She remains on  dopamine at 10.  We are asked to consult regarding her septic shock, severe  sepsis and also her atypical pulmonary infiltrates.   PAST MEDICAL HISTORY:  1. AML as detailed above.  2. Depression.  3. Gastroesophageal reflux disease.  4. Status post cholecystectomy.  5. Status post tonsillectomy.  6. Status post adenoidectomy.   ALLERGIES:  Aspirin.   SOCIAL HISTORY:  She does not use alcohol or tobacco.   FAMILY HISTORY:  Significant for hypertension, coronary artery disease and  congestive heart failure.   HOME MEDICATIONS:  1. Tussin DM 1 to 2 teaspoons as needed.  2. Lorazepam 1 mg by mouth every four hours as needed.  3. Magnesium chloride.  4. Potassium chloride.  5. Oxycodone p.r.n.   CURRENT MEDICATIONS:  1. Protonix 40 mg by mouth daily.  2. Guaifenesin 600 mg twice daily.  3. Gentamicin IV per pharmacy.  4. Magnesium oxide 400 mg three times daily.  5. Piperacillin tazobactam 3.375 g every six hours.  6. Voriconazole 200 mg IV every 12 hours.  7. Phenergan p.r.n.  8. Tylenol p.r.n.  9. Oxycodone p.r.n.  10.Dopamine currently at 10 mcg/kg per minute.   PHYSICAL EXAMINATION:  VITAL SIGNS:  Temperature 98.2, heart rate 105, blood  pressure 80/40, respiratory rate 16, SPO2 99% on room air.  GENERAL:  This is a pleasant, comfortable woman who is in no acute distress  despite her hypotension.  She interacts appropriately.  HEENT:  Oropharynx clear.  She did not have any evidence of thrush.  Pupils  equal, round and reactive to light and accommodation.  LUNGS:  Decreased at both bases.  She has  a few inspiratory crackles that  clear with a deep breath.  HEART:  Is tachycardic, regular, with a 2/6 systolic murmur that is best  heard at the left sternal border.  ABDOMEN:  Tender to palpation in the midepigastrium and suprapubic region.  She has no rebound and she has positive bowel wounds.  EXTREMITIES:  No cyanosis, clubbing or edema.  NEUROLOGIC:  She alert and oriented x3 and has grossly nonfocal exam.   Her urine output over the last eight hours has been 2350 mL.   LABORATORY DATA:  White blood cell count is 3700 down from 11,500 at  presentation. Hematocrit 29.4, platelet count 62,000 which is low but  stable.  Sodium 139, up from 129 at presentation, potassium 4.1, chloride  104, CO2 26, BUN less than 1, creatinine 0.4, glucose 107, liver function  tests were within normal limits on July 06, 2006, albumin 2.1, TSH 0.124  on October 14.  Calcium 9.4.  Cortisol random  done on October 18 was 16.6.  She has had a peripheral smear that shows no evidence of schistocytes.  A CT  has been performed of her chest, abdomen and pelvis on October 14.  This  showed bilateral basilar nodular opacities without any evidence of air fluid  levels.  There were discrete additional nodular opacities at the left apex  and also in the superior right lower lobe which are both new from her CT  scan in March of 2006. Her abdominal scan showed no pyelonephritis or  appendicitis.  She had some mild biliary tree dilatation.  Her pelvis showed  some adnexal cysts consistent with ovarian cysts bilaterally.   IMPRESSION:  1. History of AML reportedly in remission with her last chemotherapy in      July 2007.  2. Septic shock secondary to E. coli bacteremia and urinary tract      infection now complicated by persistent hypotension and pressor needs.      This certainly could be due to inadequate volume resuscitation or a      second infectious focus that has not been covered by her current      antimicrobials.  Also she has a high urine output and she may have      relative hypovolemia in the setting of diabetes insipidus.  We will      transduce her CVP and replace volume as indicated.  I will repeat her      cultures and add back vancomycin until these are completed.      Transthoracic echocardiogram will be performed to ensure she has intact      left ventricular function. We will also check cosyntropin stim test and      her co-oximetry. I will check LFTs and lipase to evaluate for other      possible sources of infection.  We will also need a work-up of her      pulmonary process as indicated below to ensure that she does not have a      pulmonary infection that is contributing to this presentation.  3. Nodular pulmonary infiltrates.  Differential diagnosis includes      atypical infection, an embolic process with septic emboli or possibly     even malignancy.  I will check a  transthoracic echocardiogram to rule      out valvular embolic process.  I agree with continuing voriconazole      until we have an etiology for these nodular infiltrates.  We will  repeat a chest x-ray now and will obtain a sputum culture and cytology.      She may require biopsy if the above are unrevealing.  4. Decreased TSH.  I will order a thyroid panel to further evaluate.  5. High urine output and rising sodium.  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 We will start      by checking the serum and urine osmolality.           ______________________________  Leslye Peer, MD     RSB/MEDQ  D:  07/09/2006  T:  07/10/2006  Job:  [id number]

## 2011-02-04 NOTE — H&P (Signed)
NAME:  Elizabeth [name], Elizabeth [name] NO.:  [id number]   MEDICAL RECORD NO.:  [id number]          PATIENT TYPE:  INP   LOCATION:  0252                         FACILITY:  [facility]   PHYSICIAN:  Lajuana Matte, MD  DATE OF BIRTH:  January 15, 1966   DATE OF ADMISSION:  01/31/2005  DATE OF DISCHARGE:                                HISTORY & PHYSICAL   REASON FOR ADMISSION:  A 45 year old African-American female with a history  of acute myelogenous leukemia admitted for consolidation chemotherapy with  high-dose cytarabine.   HISTORY:  Elizabeth [name] is a very pleasant 45 year old African-American female  who was diagnosed on December 13, 2004 with acute myelogenous leukemia after  she presented with severe anemia, thrombocytopenia, and leukocytosis.  The  patient underwent induction chemotherapy on December 15, 2004 with Idarubicin,  as well as cytarabine.  She is day +48 since her induction.  Her bone marrow  biopsy on day #14 post-induction was unremarkable for any evidence of  leukemia.  The patient was discharged from [facility] on January 11, 2005, and she did fine since her discharge.  She was admitted today for the  first cycle of consolidation chemotherapy with high-dose cytarabine.  She  denied having any complaints.   REVIEW OF SYSTEMS:  On review of systems today, she has no fever, chills,  headache, blurring of vision, double vision.  No chest pain, shortness of  breath, cough, or palpitations.  No nausea, vomiting, abdominal pain,  diarrhea, constipation, melena, or hematochezia.  No dysuria, hematuria,  urgency, or increased frequency.  No musculoskeletal or neurological  abnormalities.   PAST MEDICAL HISTORY:  1.  Acute myelogenous leukemia, as mentioned above.  2.  History of depression.  3.  History of peptic ulcer disease.  4.  Status post cholecystectomy.  5.  Status post tonsillectomy and adenoidectomy.   FAMILY HISTORY:  Significant for diabetes mellitus,  hypertension, and  coronary artery disease.  There is a family history of pancreatic cancer, as  well as breast cancer.  Her brother died at the age of 49 of congestive  heart failure, and she had a sister who also died in her early 18s with a  heart attack.   SOCIAL HISTORY:  She is single.  Has no children.  She used to work at the  [employer].  She denied having any history of  smoking, alcohol, or drug abuse.   ALLERGIES:  No known drug allergies.   HOME MEDICATIONS:  1.  Protonix.  2.  Oxycodone.   PHYSICAL EXAMINATION:  VITAL SIGNS:  Blood pressure of 108/61, pulse 104,  respiratory rate 20, temperature 99.7.  Oxygen saturation 99% on room air.  Weight 160.5 pounds.  GENERAL:  A very pleasant 45 year old African-American female who was awake,  alert, in no acute distress.  HEENT:  Normocephalic and atraumatic.  Clear oropharynx.  NECK:  Supple.  No lymphadenopathy.  CHEST:  Clear to auscultation.  No wheezes, crackles, or dullness to  percussion.  CARDIOVASCULAR:  Normal S1 and S2.  Regular rhythm and rate.  No murmurs,  gallops, or rubs.  ABDOMEN:  Soft, nontender, nondistended.  No masses.  EXTREMITIES:  No edema.   LABORATORY DATA:  White blood count 4.7, hemoglobin 10.2, hematocrit 29.9,  platelets 193.  Sodium 139, potassium 3.5, BUN 6, creatinine 0.5.  Alkaline  phosphatase 62.  Calcium 9.0, albumin 3.1.  LDH 109, uric acid 4.1.   ASSESSMENT AND PLAN:  This is a 45 year old African-American female with a  history of acute myelogenous leukemia diagnosed in March of 2006, status  post induction chemotherapy.  She is day +48 post induction.  The patient is  admitted today for the first dose of consolidation chemotherapy.   PLAN:  Admit the patient to the oncology service.  A bone marrow biopsy and  aspirate was performed this morning.  She will be started on cytarabine 3 gm  per meter squared every 12 hours on days 1, 3, and 5 of this  admission.  The  patient will be monitored closely for pancytopenia.  She will also be  started on prophylactic dexamethasone eye drops, and the patient will be  monitored closely with a neurologic check every shift.       MKM/MEDQ  D:  01/31/2005  T:  01/31/2005  Job:  [id number]   cc:   Barbette Hair. Arlyce Dice, M.D. [facility]

## 2011-02-04 NOTE — Discharge Summary (Signed)
NAME:  Elizabeth [name], Elizabeth [name] NO.:  [id number]   MEDICAL RECORD NO.:  [id number]          PATIENT TYPE:  INP   LOCATION:  1302                         FACILITY:  [facility]   PHYSICIAN:  Lajuana Matte, MD  DATE OF BIRTH:  Feb 08, 1966   DATE OF ADMISSION:  07/26/2005  DATE OF DISCHARGE:  08/09/2005                                 DISCHARGE SUMMARY   REASON FOR ADMISSION:  Pancytopenia status post cycle #4 of consolidation  therapy in anticipation of stem cell transplant for her acute  myelogenous  leukemia.   HISTORY OF PRESENT ILLNESS:  Elizabeth [name] was diagnosed with acute myelogenous  leukemia in March 2006 with normal cytogenetics.  She is status post  induction chemotherapy with and cytarabine given on December 15, 2004, without  evidence of leukemia and bone marrow biopsy performed on day #14 and day  #48.  She is status post four cycles of consolidation therapy given Jan 31, 2005, March 14, 2005, May 09, 2005 and July 13, 2005.  She developed  pancytopenia with neutropenic fever requiring admission after cycles #2, #3  and now, cycle #4.  She was admitted for further management of her  pancytopenia.   HOSPITAL COURSE:  The patient was supportive of IV fluids and placed on  Zosyn IV.  She was additionally supported with GI prophylaxis using Protonix  40 mg p.o. daily, Acyclovir 400 mg p.o. t.i.d. and transfusion of platelets  and irradiated, filtered packed red blood cells.  She was pan cultured and  all cultures were negative.  She received IV fluids of normal saline at 100  ml per hour, and her blood counts gradually improved to day of discharge  labs as follows:  White count 1.6, ANC 0.5, hemoglobin 8.9, hematocrit 25.5,  platelets 68,000.   CONDITION ON DISCHARGE:  Good.   DISPOSITION:  Discharged to home.   DISCHARGE MEDICATIONS:  1.  Protonix 40 mg p.o. daily.  2.  Oxycodone 5 mg p.o. q.6 h p.r.n. pain.  3.  Compazine 10 mg p.o. q.6 h p.r.n. nausea or  vomiting.  4.  Avelox 400 mg p.o. daily x5 days.   DISCHARGE INSTRUCTIONS:  She had no dietary or activity restrictions.  She  is to follow up with her Hickman catheter flush and care at the [facility] as per protocol.  Weekly CBC's at the [facility]  starting on August 15, 2005.  She will be seen in follow up by Dr. Arbutus Ped  in two weeks.     ______________________________  [name], PA.      Lajuana Matte, MD  Electronically Signed    AJ/MEDQ  D:  08/09/2005  T:  08/10/2005  Job:  [id number]

## 2011-02-04 NOTE — Discharge Summary (Signed)
NAME:  Elizabeth [name], Elizabeth [name] NO.:  [id number]   MEDICAL RECORD NO.:  [id number]          PATIENT TYPE:  INP   LOCATION:  1331                         FACILITY:  [facility]   PHYSICIAN:  Rose Phi. Myna Hidalgo, M.D. DATE OF BIRTH:  [birth year]   DATE OF ADMISSION:  02/10/2006  DATE OF DISCHARGE:                                 DISCHARGE SUMMARY   DIAGNOSES ON TRANSFER:  1.  Relapsed acute myeloid leukemia.  2.  Profound hypokalemia.  3.  Thrombocytopenia.  4.  Gram-positive cocci in clusters in the blood.   CONDITION ON DISCHARGE:  Stable.   ACTIVITY:  The patient will likely be mostly at bedrest.   DIET:  The patient will have a regular diet.   MEDICATIONS:  Her medications upon transfer are:  1.  Vancomycin - IV - dosing per pharmacy.  2.  Primaxin 500 mg IV q.i.d.  3.  Potassium 40 mEq p.o. daily.   HOSPITAL COURSE:  Ms. Tull is a very nice 45 year old African-American who  has a history of acute myeloid leukemia. She was initially diagnosed back in  March of 2006. She was treated with idarubicin and ARA-C. She subsequently  underwent consolidation with high-dose ARA-C. This was completed in October  2006.   She is followed by Dr. Arbutus Ped. She was seen for routine exam. She was noted  to have fall in her platelet count.   Dr. Arbutus Ped subsequently went ahead and did a bone marrow biopsy on Feb 10, 2006. The final pathology report is pending. The patient had a temperature  of 101.5. She was found to have a hemoglobin of 7.6 and platelet count of  23,000. White cell count was 13.3.   She was admitted.   She subsequently was found to have a potassium of 1.9. She was given  potassium supplementation.   She also received some platelets while in the hospital.   She had blood cultures done which showed a gram-positive cocci in clusters.  Also, growing were gram-positive rods. She does have a Hickman catheter in  place.   She was started on Primaxin and IV  vancomycin.   She was placed on a monitored bed. On the morning of May 26, she was stable  hemodynamically. Her cardiac monitor showed normal sinus rhythm. Her  potassium was 2.9. She continued to receive supplementation. We did also run  a magnesium level on her. Magnesium was 1.5.   She had a followup platelet count which was 48,000 post-platelet  transfusion.   Dr. Arbutus Ped had spoken with Dr. Marlaine Hind at [facility]. Dr. Greggory Stallion  agreed to accept her in transfer for consideration of reinduction of therapy  and high-dose chemotherapy and stem-cell transplant.   PHYSICAL EXAMINATION:  GENERAL:  Upon transfer, she felt well. She had a  little bit of a temperature of 100.6. Blood pressure was 96/59. Her  hemoglobin was 9.6. Her white cell count was 8.5. She had normal BUN and  creatinine.  HEENT:  Her oral cavity showed no mucositis.  LUNGS:  Were clear bilaterally.  CARDIAC:  Exam was tachycardic  but regular.  ABDOMEN:  Abdominal exam soft with good bowel sounds. There is no palpable  abdominal mass. There is no palpable hepatosplenomegaly.  EXTREMITIES:  Showed no clubbing, cyanosis, or edema.   Her chest x-ray on admission showed some mild increase in diffuse  peribronchial thickening. There was vague increased density in both lung  bases. No obvious consolidation was noted.      Rose Phi. Myna Hidalgo, M.D.  Electronically Signed     PRE/MEDQ  D:  02/11/2006  T:  02/11/2006  Job:  [id number]

## 2011-02-04 NOTE — Discharge Summary (Signed)
NAME:  Elizabeth [name], Elizabeth [name] NO.:  [id number]   MEDICAL RECORD NO.:  [id number]          PATIENT TYPE:  INP   LOCATION:  1320                         FACILITY:  [facility]   PHYSICIAN:  Lajuana Matte, MD  DATE OF BIRTH:  09/08/1966   DATE OF ADMISSION:  03/14/2005  DATE OF DISCHARGE:  03/19/2005                                 DISCHARGE SUMMARY   REASON FOR ADMISSION:  A 45 year old African-American female with history of  AML admitted for second cycle of consolidation chemotherapy with high dose  cytarabine.   HISTORY:  Elizabeth [name] is a very pleasant 45 year old African-American female  who was diagnosed with acute myeloid leukemia in March 2006 status post  induction chemotherapy with idarubicin and cytarabine with no evidence of  leukemia on bone marrow which was performed on day #14 and day #48. The  patient also is status post one cycle of consolidation chemotherapy with  high-dose cytarabine given on Jan 31, 2005. She tolerated it fine except for  an admission with significant anemia and neutropenia. She presented today  for the second cycle of consolidation chemotherapy. She is doing fine with  no significant complaints. On the day of admission, her blood pressure was  98/57, pulse 86, respiratory rate 20, temperature 98.6, oxygen saturation  100% on room air. Laboratory data on the day of admission showed white blood  count of 4.5, platelets 126, hemoglobin 10.8, hematocrit 31.3, sodium 141,  potassium 3.2, LDH 170, glucose 91, calcium 9.3, BUN 10, creatinine 0.5.   HOSPITAL COURSE:  #1 - ACUTE MYELOID LEUKEMIA. The patient was started on  high-dose cytarabine with a dose of 3 g/m2 q.12h. on day #1, #3, and #5 of  her admission. She was also started at the same time on dexamethasone 0.1%  eyedrops for prophylaxis. The patient continued to do fine during her  admission with no significant complaints and no side effects from the  chemotherapy. She completed her  course of chemotherapy on July 1 a.m. and  was discharged in stable condition.   #2 - ANEMIA. Her hemoglobin dropped down to 8.9 on March 18, 2005 and she  received 1 unit of packed rbc's before discharge.   #3 - GASTROINTESTINAL PROPHYLAXIS. The patient continued on Protonix 40 mg  p.o. q.d.   #4 - DEEP VENOUS THROMBOSIS PROPHYLAXIS. She was placed on sequential  compression devices during her admission   #5 - The patient received a dose of Depo-Provera 150 mg intramuscular on  March 18, 2005 for menstrual cessation before discharge.   DISCHARGE CONDITION:  Stable.   DISCHARGE DIAGNOSES:  1.  Acute myeloid leukemia status post chemotherapy.  2.  History of gastroesophageal reflux disease.  3.  Anemia.  4.  Thrombocytopenia   DISCHARGE MEDICATIONS:  1.  Avelox 400 mg p.o. daily x14 days.  2.  Diflucan 100 mg p.o. daily x14 days.  3.  Acyclovir 400 mg p.o. t.i.d. x14 days.  4.  Protonix 40 mg p.o. daily.  5.  Oxycodone 5 mg p.o. q.6h. p.r.n. pain.  6.  Compazine 10 mg p.o. q.6h.  p.r.n. nausea and vomiting.  7.  Dexamethasone 0.1% eyedrops for 48 hours after discharge.   DISCHARGE ACTIVITY:  No restriction.   DISCHARGE DIET:  No restriction.   DISCHARGE FOLLOW-UP:  1.  With Dr. Arbutus Ped at the [facility] as scheduled.  2.  The patient will have weekly lab work including CBC and LDH performed at      the [facility].       MKM/MEDQ  D:  03/22/2005  T:  03/22/2005  Job:  [id number]

## 2011-02-04 NOTE — Discharge Summary (Signed)
NAME:  Elizabeth [name], Elizabeth [name] NO.:  [id number]   MEDICAL RECORD NO.:  [id number]          PATIENT TYPE:  INP   LOCATION:  1305                         FACILITY:  [facility]   PHYSICIAN:  Lajuana Matte, MD  DATE OF BIRTH:  December 31, 1965   DATE OF ADMISSION:  07/01/2006  DATE OF DISCHARGE:  07/15/2006                               DISCHARGE SUMMARY   ADDENDUM:   REASON FOR ADMISSION:  Abdominal pain with nausea and vomiting.   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  She was last treated with chemotherapy in  July and August of 2007 at [facility].  Over the past 5  days, she developed cough productive of clear phlegm as well as nausea  and vomiting.  She contacted her physician at [facility],  and a prescription for Ativan as well as Tussin DM was called in for  her.  She has also had intermittent problems with abdominal pain for the  past 5 days.  Her symptoms worsened on the day of admission, developing  3 loose stools with subjective fevers.  She presented to the emergency  room for further evaluation.  She described her abdominal pain as sharp,  aching in nature, intermittent, and lasting for approximately 30 minutes  at a time.  She denied dysuria, hematemesis, melena, or hematochezia.  In the [facility] emergency room, she was found to be hypotensive with  a low blood pressure of 90/61 and febrile to a T max of 103.4 degrees.  She was admitted initially to the [facility] service for further  evaluation and management.   HOSPITAL COURSE:  As stated, the patient was initially admitted to the  Hospitalist service for further evaluation and management of her  presenting symptoms.  However, she was felt to be in septic shock and  was admitted to the intensive care unit for further management, and a  hematology/oncology consult was obtained.  The patient was transferred  to the oncology service.  She was found to have a UTI bacteremia with E  coli bacteria causing septic shock.  She was in the intensive care unit  from July 02, 2006 through July 13, 2006.  She was aggressively  treated with rehydration as well as IV antibiotics with vancomycin and  Zosyn.  She developed hyponatremia which was felt to be secondary to  sepsis, and IV fluids were continued.  She was hypotensive and placed on  a dopamine drip until her blood pressures normalized.  She was able to  be weaned off of the dopamine with stable blood pressures on July 10, 2006.  She was also hypokalemic and hypomagnesemic, and both the  potassium and magnesium were repleted.  She developed anemia with a low  hemoglobin of 8.4 and received a total of 3 units of packed  red blood  cells that were irradiated and filtered.  She was thrombocytopenic with  a low platelet count of 27.  However, this normalized throughout the  course of the admission without the need for platelet transfusion.   A CT of the chest, abdomen, and pelvis performed on July 02, 2006  revealed new bilateral airspace opacities with nodular components,  suspicious for pneumonia.  It was felt that it could be a septic emboli.  Drug reaction and leukemic infiltrates were less likely considerations.  CT of the abdomen revealed no CT evidence for pyelonephritis or acute  appendicitis; however, the appendix was not clearly seen.  There was  mildly progressive biliary dilation, status post cholecystectomy.  It  was felt to be physiologic and recommended correlation with liver  function tests.  A CT of the pelvis revealed septated cystic lesions in  both adnexa, probably ovarian cysts; however, it was felt the one on  the  right could contribute to the patient's flank pain.  Ultimately she  received antibiotic therapy, including vancomycin and Zosyn as well as  gentamycin, and on July 05, 2006, Vfend was added to address possible  fungal infection as well.   With the exception of fever on admission, she quickly defervesced with  IV antibiotics and remained afebrile.  As previously stated, she was  weaned off the dopamine, and blood pressures remained stable on July 10, 2006, and she was transferred to the oncology floor on July 13, 2006 when a bed was available.  She continued to receive her IV  antibiotics; however, she had some intermittent episodes of subjective  numbness affecting her chest, legs, and abdomen when she received the IV  Zosyn and IV Vfend.  The rate of the IV was slowed when these drugs were  given, and her symptoms resolved.   A repeat CT of the chest with contrast on July 09, 2006 revealed  decreased size of the pulmonary nodules; however, some of the nodules  were now cavitary.  The left upper lobe was found to have air crescent  with internal nodularity questionable for mycetoma or aspergilloma,  although not considered pathopneumonic, as it may represent non-fungal  cavitation.  Also noted were new small bilateral pleural effusions,  similar appearance of ground-glass opacities of both lower lobes.  There  was mild improvement as compared to previous studies.   Elizabeth [name] continued to improve.  She did have some high output urine  thought to be secondary to diabetes insipidus while she was in the ICU.  Urine specific gravity was not consistent with diabetes insipidus, and a  CT of the head obtained on July 10, 2006 did not reveal brain  metastasis or intracranial hemorrhage.  IV fluids were continued, and  her intake and output normalized/equalized.   CONDITION ON DISCHARGE:  Good.   DISCHARGE DIET:  No restrictions on diet.  DISCHARGE ACTIVITIES:   Increase activity slowly.   DISCHARGE MEDICATIONS:  1. OxyIR 5 mg 1 to 2 p.o. q.4-6h. p.r.n. pain.  2. Vancomycin 1500 mg IV q.12h. for an additional 7 days.  3. Vfend 200 mg IV q.12h. for an additional 7 days.  4. The IV antibiotics will be administered and arranged through      [facility].  5. She will continue on Robitussin DM 5 to 10 mL q.4h. p.r.n. cough.  6. Potassium chloride 10 mEq daily.  7. She will be sent out with an additional 7-day course of Cipro 500  mg p.o. q.12h.  8. Throughout the course of her admission for diarrhea, C difficile      samples were obtained and were negative thus far.  An additional      sample was sent just prior to discharge, and the result is pending.      In the interim, she is to use Imodium as needed for loose stools.   FOLLOW UP:  She is to follow up with Dr. Greggory Stallion at [facility].  An appointment is set for July 19, 2006 at 10 o'clock in the morning.  She will follow up with Dr. Arbutus Ped in 2 weeks, and the patient is to  call for this specific appointment.      [name], PA.      Lajuana Matte, MD  Electronically Signed    AJ/MEDQ  D:  08/11/2006  T:  08/11/2006  Job:  [id number]

## 2011-02-04 NOTE — H&P (Signed)
NAME:  Elizabeth [name], Elizabeth [name] NO.:  [id number]   MEDICAL RECORD NO.:  [id number]          PATIENT TYPE:  INP   LOCATION:  1302                         FACILITY:  [facility]   PHYSICIAN:  Lajuana Matte, MD  DATE OF BIRTH:  08-17-1966   DATE OF ADMISSION:  07/26/2005  DATE OF DISCHARGE:                                HISTORY & PHYSICAL   REASON FOR ADMISSION:  Neutropenia/pancytopenia.   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 She is  status post induction chemotherapy with idarubicin and cytarabine given on  December 15, 2004 without evidence of leukemia on bone marrow biopsy performed  on day #14 and day #48. She is status post four cycles of consolidation  chemotherapy with high dose ARA-C, first cycle given Jan 31, 2005, #2 March 14, 2005, #3 May 09, 2005, #4 July 13, 2005. A bone marrow biopsy was  performed during the July 13, 2005 admission and was negative for  leukemia. She developed severe neutropenia/pancytopenia after cycles #2 and  3. She presented to the [facility] today for followup lab work  and was again found to be severely neutropenic. The patient will be admitted  to the Oncology Service for further evaluation and treatment including  platelet and red blood cell transfusion as needed.   REVIEW OF SYMPTOMS:  The patient denies fever, chills, headache, double or  blurred vision. She denies shortness of breath, chest pain, cough or sputum  production. She has not had any nausea, vomiting, constipation, diarrhea,  melena or hematochezia. She denies a history of dysuria, urgency or  increased frequency. She does complain of a mild headache but otherwise  voids with no other musculoskeletal or neuroskeletal complaints.   FAMILY HISTORY:  Significant for diabetes mellitus, hypertension and  coronary artery disease, pancreatic and breast  cancer. Her brother died at  the age of 55 with CHF, and a sister died in her early 49's from a heart  attack.   SOCIAL HISTORY:  She is single without any children. She previously worked  at the [employer] in [employer]. She denies any tobacco,  alcohol or drug abuse.   ALLERGIES:  ASPIRIN.   HOME MEDICATIONS:  1.  Protonix 40 mg p.o. daily.  2.  OxyIR 5 mg every 4-6 hours as needed.   PHYSICAL EXAMINATION:  VITAL SIGNS:  Per nursing flow sheet are stable and  she is currently afebrile.  GENERAL:  A pleasant, 45 year old, African-American female who is awake,  alert and oriented.  HEENT:  Normocephalic, atraumatic, oropharynx is clear.  NECK:  Supple without lymphadenopathy.  LUNGS:  Entirely clear to auscultation without wheezes, rales, rhonchi or  dullness to percussion.  CARDIOVASCULAR:  Reveals normal S1 and S2 without appreciable murmurs,  gallops or rubs.  ABDOMEN:  Soft, nontender, nondistended without masses.  EXTREMITIES:  Nontender without edema.   LABORATORY DATA:  Reveals a white count of 0.4, ANC of 0, hemoglobin of 8.9,  hematocrit of 25.1 and platelets of  8. CMET and LDH are pending at time the  labs are drawn at the [facility].   ASSESSMENT/PLAN:  This is a 45 year old African-American female with acute  myelocytic anemia status post four cycles of consolidation chemotherapy with  high dose ARA-C. She is admitted today for further management of her  neutropenia and pancytopenia. We will support her with IV fluids of normal  saline at 100 mL per hour. We will obtain urine cultures, blood cultures and  chest x-ray and follow her with daily CBC with differentials. She will be  covered with acyclovir 400 mg t.i.d., Diflucan 100 mg daily, protonix 40 mg  daily and will be transfused 1 unit of platelets today. We will obtain a  type and hold for 2 units of filtered irradiated packed red blood cells for  expected further decrease in her  hemoglobin and hematocrit requiring  transfusion. She will also be given cefepime 2 grams p.o. q.12 hours.     ______________________________  [name], PA.      Lajuana Matte, MD  Electronically Signed    AJ/MEDQ  D:  07/27/2005  T:  07/27/2005  Job:  [id number]

## 2011-02-04 NOTE — Discharge Summary (Signed)
NAME:  Elizabeth [name], Elizabeth [name] NO.:  [id number]   MEDICAL RECORD NO.:  [id number]          PATIENT TYPE:  INP   LOCATION:  1310                         FACILITY:  [facility]   PHYSICIAN:  Lajuana Matte, MD  DATE OF BIRTH:  [birth year]   DATE OF ADMISSION:  06/27/2005  DATE OF DISCHARGE:  07/06/2005                                 DISCHARGE SUMMARY   REASON FOR ADMISSION:  This 45 year old, African-American female with  history of acute myeloid leukemia who was admitted for treatment of  pneumonia.   HISTORY OF PRESENT ILLNESS:  Ms. Thole is a pleasant, 45 year old, African-  American female who was diagnosed with acute myeloid leukemia in March 2006.  She is status post induction of chemotherapy with idarubicin and cytarabine  given December 15, 2004, with no evidence of leukemia on the bone marrow  performed on day #14 and day #48 of post induction.  The patient is also  status post three cycles of consolidation chemotherapy with high-dose  cytarabine with last dose given May 09, 2005.  Cycle #2 and cycle #3 were  complicated by significant pancytopenia and neutropenic fever requiring  admission.  She presented with complaints of cough productive of greenish,  yellow sputum, nausea and vomiting.  She was evaluated in the emergency room  with a chest x-ray and was admitted to the oncology service for further  treatment.   HOSPITAL COURSE:  The patient was supported with IV fluids and placed on  Zosyn IV.  Antiemetics were given in the form of Zofran 4 mg IV as needed.  Blood cultures, urine cultures and sensitivities were obtained and she was  additionally supported with GI prophylaxis with Protonix and DVT prophylaxis  with sequential compression devices.  Her urine culture grew out vancomycin-  resistant enterococcus and she was placed on Zyvox as this was the only  antibiotic that it was sensitive to.  Her Zosyn was discontinued.  She  additionally had her Hickman  catheter removed as this appeared to be a  possible source of recurrent infection.  She tolerated removal of the  Hickman without difficulty.   DISCHARGE DIAGNOSES:  1.  Acute myeloid leukemia receiving consolidation chemotherapy in      preparation for stem cell transplant.  2.  History of gastroesophageal reflux, stable on Protonix.  3.  History of hypokalemia currently supported with potassium chloride by      mouth at 10 mEq daily.  4.  Pneumonia, resolved.  5.  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   CONDITION ON DISCHARGE:  Stable.   DISCHARGE MEDICATIONS:  1.  Protonix 40 mg p.o. daily.  2.  K-Dur 10 mEq p.o. daily.  3.  Zyvox 600 mg q.12h. for an additional 5 days.  4.  Diflucan 200 mg p.o. daily for an additional 5 days.  5.  Tussionex 5 ml p.o. nightly as needed for cough.  6.  Oxycodone 5 mg q.4h. p.r.n. pain.   FOLLOW UP:  She is to follow up with Dr. Arbutus Ped within 1 week.     ______________________________  [name],  PA.      Lajuana Matte, MD  Electronically Signed    AJ/MEDQ  D:  07/14/2005  T:  07/14/2005  Job:  [id number]

## 2011-02-04 NOTE — H&P (Signed)
NAME:  Elizabeth [name], Elizabeth [name] NO.:  [id number]   MEDICAL RECORD NO.:  [id number]          PATIENT TYPE:  INP   LOCATION:  0102                         FACILITY:  [facility]   PHYSICIAN:  Lajuana Matte, MD  DATE OF BIRTH:  09/29/1965   DATE OF ADMISSION:  06/27/2005  DATE OF DISCHARGE:                                HISTORY & PHYSICAL   REASON FOR ADMISSION:  A 45 year old African-American female with a history  of acute myeloid leukemia admitted for treatment of pneumonia.   HISTORY:  Elizabeth [name] is a very pleasant 45 year old African-American female  who was diagnosed with acute myeloid leukemia in March, 2006.  The patient  is status post induction of chemotherapy with idarubicin and cytarabine  given on December 15, 2004 with no evidence of leukemia on the bone marrow  performed on day 14 and day 48 of post-induction.  The patient is also  status post three cycles of consolidation chemotherapy with high-dose  cytarabine.  The last dose was given on May 09, 2005.  That was  complicated with significant pancytopenia and neutropenic fever.  The blood  culture during the previous admission was positive for Staphylococcus  aureus.  The patient was treated with a course of antibiotics in the form of  cefepime, vancomycin, and gentamicin.  She was discharged from the hospital  on May 30, 2005.  She was doing fine at that time except for a mild  cough.  She was taking Hycodan for it with some mild improvement.  The  patient mentioned that this morning she woke up with chest congestion, nasal  congestion, as well as fever up to 101.2.  She also had severe cough  productive of greenish-yellow sputum and nausea and vomiting.  She came to  the emergency department today for further evaluation.  The chest x-ray  performed in the emergency department at [facility] showed patchy  areas of pneumonia bilaterally as well as some possible vascular congestion.  The patient  was admitted for further evaluation and management of her  pneumonia and dehydration.  When seen today, she was feeling slightly better  after receiving IV hydration.  She had two blood cultures performed.   Review of systems today was significant for the fever.  No chills.  She had  no headache.  She had no chest pain but mild shortness of breath with cough  productive of greenish-yellow sputum.  No hemoptysis.  No syncope.  She also  complaining of frequent episodes of nausea and vomiting up to seven times  today.  Mild lower abdominal pain.  No diarrhea, constipation, melena or  hematochezia.  No dysuria, urgency, or increased frequency.   PAST MEDICAL HISTORY:  1.  Acute myeloid leukemia, as described before.  2.  History of gastroesophageal reflux disease.  3.  History of depression.  4.  Status post cholecystectomy.  5.  Status post tonsillectomy and adenoidectomy.   FAMILY HISTORY:  Positive for hypertension, diabetes mellitus, and coronary  artery disease.  She had a brother who died at age 33 of congestive heart  failure, and her sister died in her early 17s with a heart attack.   SOCIAL HISTORY:  She is single.  No children.  Currently on disability.  Denies having any history of smoking, alcohol, or drug abuse.   ALLERGIES:  She has an intolerance to ASPIRIN.   HOME MEDICATIONS:  Include Protonix and oxycodone.   PHYSICAL EXAMINATION:  VITAL SIGNS:  Heart rate was in the range of 140-150,  blood pressure 100/65, respiratory rate 24, temperature 101.2.  GENERAL:  A 45 year old African-American female awake and alert in no acute  distress.  HEENT:  Normocephalic and atraumatic.  Clear oropharynx.  NECK:  Supple.  No lymphadenopathy.  LUNGS:  A few bilateral basilar crackles.  CARDIOVASCULAR:  Normal S1 and S2.  Tachycardic.  No murmur or gallop.  ABDOMEN:  Soft.  Mildly tender at the suprapubic area.  EXTREMITIES:  No edema.   LABORATORY DATA:  White blood count 11,  hemoglobin 9.2, hematocrit 26.6,  platelets 123.  Comprehensive metabolic panel showed sodium of 140,  potassium 3.1, glucose 111, BUN 5, creatinine 0.5, total bilirubin 1,  alkaline phosphatase 108, AST 26, ALT 31, calcium 8.8, albumin 3.  Urinalysis was unremarkable except for increased white blood count of 11-20.   ASSESSMENT/PLAN:  1.  This is a 45 year old African-American female with a history of acute      myeloid leukemia, status post induction of chemotherapy, status post      three cycles of consolidation chemotherapy with high dose ARA-C, who      presented today complaining of fever, nausea and vomiting, as well as      cough productive of greenish-yellow sputum.  Chest x-ray was consistent      with bilateral patchy pneumonia.  We will admit the patient to the      oncology service, start IV hydration with normal saline.  2.  Patient will be started on antibiotic treatment with Zosyn 4.5 gm q.6h.      I will continue the patient on  an antiemetic in the form of Zofran 4 mg      IV as needed.  I will await the pending results of the blood culture as      well as urine culture and sensitivity.  The patient will be started on      gastrointestinal prophylaxis with Protonix and DVT prophylaxis with      sequential compression devices.  For pain management, she will continue      on OxyIR as needed.      Lajuana Matte, MD  Electronically Signed     MKM/MEDQ  D:  06/27/2005  T:  06/27/2005  Job:  [id number]

## 2011-02-04 NOTE — Discharge Summary (Signed)
NAME:  TAIWAN, MILLON NO.:  [id number]   MEDICAL RECORD NO.:  [id number]          PATIENT TYPE:  INP   LOCATION:  1305                         FACILITY:  [facility]   PHYSICIAN:  Lajuana Matte, MD  DATE OF BIRTH:  11/22/1965   DATE OF ADMISSION:  07/01/2006  DATE OF DISCHARGE:  07/15/2006                               DISCHARGE SUMMARY   HISTORY OF PRESENT ILLNESS:  Mrs. Pernice is a 45 year old African  American female   See History and Physical   HOSPITAL COURSE:  As stated, the patient was initially admitted to the  Hospitalists service for further evaluation and management of her  present symptoms.  However, she was felt to be in septic shock and was  admitted to the intensive care unit and a Hematology/Oncology consult  was obtained.  The patient was transferred to the Oncology Service.  She  was found to have UTI bacteremia with E. coli bacteria causing septic  shock.  She was in the intensive care unit from October 14-25, 2007.  She was treated aggressively with rehydration as well as IV antibiotics  with Vancomycin and Zosyn.  She developed hyponatremia which was felt to  be secondary to sepsis and IV fluids were continued.  She was  hypotensive and placed on dopamine drip until her blood pressures  normalized.  She was able to be weaned off the dopamine with stable  blood pressures on July 10, 2006.  She was also hypokalemic and  hypomagnesemic, and both the potassium and magnesium were repleted.  She  developed anemia with low hemoglobin of 8.4 and received a total of 3  units, it radiated and filtered units of packed red blood cells.  She  was thrombocytopenic with a low platelet count of 27.  However, this  normalized throughout the course of the admission without the need for  platelet transfusion.  CT of the chest, abdomen and pelvis, performed on  July 02, 2006, revealed new bilateral air space opacities with  nodular components suspicious  for pneumonia.  It was felt that it could  be septic emboli.  Drug reaction and leukemic infiltrates were less  likely consideration.  CT of the abdomen revealed no CT evidence for  pyelonephritis or acute appendicitis.  However, the appendix was not  clearly seen.  It was mildly progressive biliary dilation, status post  cholecystectomy.  It was felt to be physiologic and recommended  correlation with liver function tests.  CT of the pelvis revealed  septated cystic lesions in both adnexa, probably ovarian cysts.  However, it was felt the one on the right could contribute to the  patient's flank pain.  Ultimately, she received antibiotic therapy  including vancomycin, Zosyn, gentamycin and on October 17, Vfend was  added to address possible fungal infection as well.  With the exception  of fever on admission, she quickly defervesced with IV antibiotics and  remained afebrile.  As previously stated, she was weaned off the  dopamine and blood pressures remained stable on October 22, and she was  transferred to the oncology floor on  October 25 when a bed was  available.  She continued to receive her IV antibiotics.  However, had  some intermittent episodes of subjective numbness affecting her chest,  legs and abdomen when she received the IV Zosyn and IV Vfend.  The rate  of the IV was slow when these drugs were given, and her symptoms  resolved.  A repeat CT of the chest with contrast on October 21 revealed  decreased size of the pulmonary nodules.  Some of the nodules were now  cavitary.  Nodules on the left upper lobe was found to have had an air  crescent with internal nodularity, questionable for mycetoma or  aspergilloma, although, not considered pathognomonic, as it may  represent nonfungal crepitation.  Also noted were new small bilateral  pleural effusions in similar appearance of ground glass opacities of  both lower lobes.  However, there was mild improvement as compared to   previous studies.  Mrs. Garmany continued to improve.  She did have some  high output urine thought to be secondary to diabetes insipidus while  she was in the ICU.  Urine specific gravity was not consistent with  diabetes insipidus, and a CT of the head was obtained on October 22, and  there were no brain mets or intracranial hemorrhage noted.  IV fluids  were continued and her intake and output normalized/equalized.   CONDITION ON DISCHARGE:  Good.   DISCHARGE INSTRUCTIONS:  1. Diet:  No restrictions.  2. Activity:  Increase activity slowly.   DISCHARGE MEDICATIONS:  1. OxyIR 5 mg 1-2 p.o. q.6 h p.r.n. pain.  2. Vancomycin 1500 mg IV q.12 h x7 days.  3. Vfend 200 mg IV q.12 h x7 days.  4. IV antibiotics are arranged through [facility].  5. She will continue on Robitussin DM 5-10 ml p.o. q.4 h p.r.n. cough.  6. Potassium chloride 10 mEq p.o. daily.  7. She will be sent out with an additional 7 day course of Cipro at      500 mg p.o. q.12 h.  8. Throughout the course of her admission, for her diarrhea, C.      difficile samples were obtained and were negative thus far.  An      additional sample was sent just prior to discharge, and result is      pending.  In the interim, she is to use Imodium as needed for loose      stools.   DISCHARGE FOLLOWUP:  She is to follow up with Dr. Red Christians at [facility]  This appointment is set up for October 31 at 10:00, and  she will return for follow up with Dr. Shirline Frees in two weeks.  The  patient will call for an appointment.      [name], PA.      Lajuana Matte, MD  Electronically Signed    AJ/MEDQ  D:  07/15/2006  T:  07/17/2006  Job:  [id number]

## 2011-02-04 NOTE — Consult Note (Signed)
NAME:  Elizabeth [name], Elizabeth [name] NO.:  [id number]   MEDICAL RECORD NO.:  [id number]          PATIENT TYPE:  INP   LOCATION:  0159                         FACILITY:  [facility]   PHYSICIAN:  Rose Phi. Myna Hidalgo, M.D. DATE OF BIRTH:  03/14/1966   DATE OF CONSULTATION:  07/02/2006  DATE OF DISCHARGE:                                   CONSULTATION   REFERRING PHYSICIAN:  Corinna L. Lendell Caprice, MD   REASON FOR CONSULTATION:  1. AML, question relapse.  2. Sepsis.   HISTORY OF PRESENT ILLNESS:  Elizabeth [name] is a 45 year old black female who is  followed by Elizabeth [name].  She has acute myeloid leukemia.  She has been  treated in the past with chemotherapy.  She was treated with routine  induction and consolidation chemotherapy in [place].  She subsequently  was found to have a relapse I think in March or April of this year.  The  patient was seen in [facility] for consideration of a  transplant.  She was given cytarabine and clofarabine.  She had a very good  response after 1 cycle.  She went into remission.   She received the second cycle of chemotherapy I think back in July.  This  was complicated by infectious problems.   She subsequently was discharged from [facility].   She was last seen in the office in September.  She now comes in with on day  of temperature of 103. She has had nausea and vomiting.  She has a little  bit of cough.   She was brought to the emergency room.  She had blood work done. White cell  count was 11.5, hemoglobin 10.8, hematocrit 31.5.  Platelet count was down  to 51.  Her electrolytes showed a sodium of 149, potassium less than 2,  chloride 75, BUN 4, creatinine 0.9.  LFTs showed total bilirubin of 1.9.  Albumin was 2.6.   She had repeat CBC done. S he had a white cell count of 14, hemoglobin 8.7,  hematocrit 25.9, platelet count 40,000.   She has been started on antibiotics.  She was brought to the ICU.   A chest x-ray was done  which showed questionable left lower lobe infiltrate.   She was started on empiric antibiotics with Zosyn and vancomycin.   Because of the history of leukemia, we are asked to see her.   She cannot give too much history otherwise.   PAST MEDICAL HISTORY:  Really unremarkable.   ALLERGIES:  ASPIRIN.   MEDICATIONS ON ADMISSION:  1. Ativan.  2. Magnesium.  3. Potassium.  4. Oxy IR.   SOCIAL HISTORY:  Negative for tobacco or alcohol use.   REVIEW OF SYSTEMS:  As stated previously.   PHYSICAL EXAMINATION:  GENERAL:  This is a well-developed, well-nourished  black female in no obvious distress. She is alert and oriented x3.  VITAL SIGNS:  Show a temperature of 99.9, pulse 112, respiratory rate 20,  blood pressure 88/50.  HEAD AND NECK: Normocephalic and atraumatic skull.  There are no ocular or  oral lesions.  There are no palpable cervical or supraclavicular lymph  nodes.  LUNGS:  Clear for the most part.  She may have decreased breath sounds at  the bases.  CARDIAC:  Tachycardic but regular without murmurs, rubs, or bruits.  ABDOMEN: Tenderness to the right lower quadrant. She has guarding in the  right lower quadrant.  Bowel sounds are decreased.  There is no ascites.  There is no palpable hepatosplenomegaly.  EXTREMITIES:  Shows no clubbing, cyanosis, or edema.  NEUROLOGIC: Showed no focal neurological deficits.   IMPRESSION:  Elizabeth [name] is a 45 year old black female with history of  relapsed acute myeloid leukemia.  So far, there is no evidence of a second  relapse.  She is thrombocytopenic which is somewhat concerning.  I would  think that her platelet count would have recovered by now.  As such, this  certainly may indicate relapse.  It also could be part of her infectious  process.  She certainly could have some degree of sepsis.  L   I have not had a chance to review a peripheral smear.  We will have to try  to find one.   I think she needs a CT scan done of the  abdomen.  We need to rule out  appendicitis.  She could have typhlitis.   She is on broad-spectrum antibiotic coverage right now.  She is not  neutropenic.  I do not think we need to add antifungal coverage right now.   Potassium clearly needs to be replaced.  We will also check her magnesium to  see if that is also low.  If so, that will also need to be replaced.   I want to just try to keep her n.p.o. for right now until we have the  results of the CT scan back.  I think we just need to start slowly with  advancing her diet once we get to that point.   She will need to be transfused in my opinion today.   This situation is somewhat critical.  She will stay in the ICU.  She will be  supported aggressively at the present time.      Rose Phi. Myna Hidalgo, M.D.  Electronically Signed     PRE/MEDQ  D:  07/02/2006  T:  07/02/2006  Job:  [id number]   cc:   Marlaine Hind, Professor of Medicine  Division of Medical Oncology  Department of Internal Medicine  [facility]  [place], [place], [place] [address]

## 2011-02-04 NOTE — H&P (Signed)
NAME:  Elizabeth [name], Elizabeth [name] NO.:  [id number]   MEDICAL RECORD NO.:  [id number]          PATIENT TYPE:  INP   LOCATION:  1413                         FACILITY:  [facility]   PHYSICIAN:  Lajuana Matte, MD  DATE OF BIRTH:  Jan 28, 1966   DATE OF ADMISSION:  02/10/2006  DATE OF DISCHARGE:                                HISTORY & PHYSICAL   REASON FOR ADMISSION:  45 year old African American female with relapse of  acute myeloid leukemia.   HISTORY:  Elizabeth [name] is a very pleasant 45 year old African American female  who was diagnosed in March 2006 with acute myelogenous leukemia with normal  cytogenetics.  The patient is status post induction chemotherapy with  Idarubicin and cytarabine given on December 15, 2004.  She had no evidence of  disease on bone marrows performed on day 14 and 48.  Then, the patient had  four cycles of consolidation chemotherapy with high dose cytarabine.  The  last dose was given on July 13, 2005.  The patient was in remission until  recently.  She was also evaluated by Dr. Hoyle Barr at [facility] in [place] for consideration of stem  cell transplant but, unfortunately, she did not have any matching siblings  and Dr. Marthe Patch recommended observation for now.  The patient was doing very  well until recently when she was seen on Jan 23, 2006, and they noticed  decrease in her hemoglobin and platelet count.  Her hemoglobin that day was  10.6, hematocrit 31.4, and platelets 138.  I recommended for the patient to  have repeat bone marrow biopsy and aspirate to rule out relapse of her  leukemia.  She came today for this procedure and when she presented to  [facility], she complained of fever up to 101.5 that started  earlier this morning.  She also had some sore throat.  The patient tolerated  the bone marrow biopsy and aspirate today well, but CBC performed  concurrently with the biopsy showed that she had  hemoglobin down to 7.6, her  platelet count was 23,000, and her white count was elevated at 13.3.  The  peripheral blood smear showed increased blood up to 73% and I asked the  pathologist to have a quick look at the bone marrow biopsy and he mentioned  that it was full of leukemic blasts.  The patient will be admitted for  further evaluation and management of her relapsed leukemia.  She denied any  having any other significant complaints recently except for mild fatigue in  addition to the fevers and chills that started earlier today.   On review of systems today, positive for fevers and chills.  No headache,  blurred vision, or double vision.  No chest pain, shortness of breath,  cough, hemoptysis, syncope, or palpitations.  No nausea, vomiting, abdominal  pain, diarrhea, constipation, melena, or hematochezia.  No dysuria or  hematuria.   PAST MEDICAL HISTORY:  Significant for:  1.  Acute myeloid leukemia diagnosed in March 2006.  2.  History of depression.  3.  Gastroesophageal reflux disease.  4.  Status post cholecystectomy.  5.  Status post tonsillectomy and adenoidectomy.   FAMILY HISTORY:  Significant for diabetes mellitus, hypertension, coronary  artery disease, and congestive heart failure.   SOCIAL HISTORY:  She is single, has no children.  She used to work in [employer] the [employer].  She is currently on disability.  She  denies having any history of smoking, alcohol, or drug abuse.   ALLERGIES:  She is allergic to ASPIRIN.   HOME MEDICATIONS:  Protonix and Oxycodone, in addition to Metoprolol started  recently by her cardiologist secondary to persistent tachycardia.   PHYSICAL EXAMINATION:  VITAL SIGNS:  Blood pressure 107/71, pulse 121, respiratory rate 20,  temperature 98.1, oxygen saturation 99% on room air.  GENERAL:  Exam showed a very pleasant 45 year old African American awake,  alert, in no acute distress.  HEENT:  Normocephalic, atraumatic,  the oropharynx had a few sores and  blisters.  NECK:  Supple, no lymphadenopathy.  LUNGS:  A few bilateral crackles, no wheezes.  HEART:  Normal S1 and S2, tachycardic, no murmurs or gallops.  ABDOMEN:  Soft, nontender, nondistended, no masses.  EXTREMITIES:  No edema.   LABORATORY DATA:  White blood cell count 15.3, absolute neutrophil count  2100, blasts 73%, hemoglobin 7.6, hematocrit 22.7, platelets 23.   ASSESSMENT:  This is a pleasant 45 year old African American female with  questionable relapse of acute myeloid leukemia.   PLAN:  I will admit the patient to [facility] today.  I contacted  Dr. Greggory Stallion at [facility] for consideration of transfer of the  patient for reinduction chemotherapy and maybe stem cell transplant in the  future.  Will check blood culture as well as urinalysis and culture and  sensitivity.  Also will check chest x-ray in addition to DIC panel.  I will  start the patient empirically on Avelox 400 mg p.o. daily.  For the anemia,  she will be transfused with 2 units packed RBCs today and I will also  transfuse platelets if needed, especially if her platelet count becomes less  than 20,000.  The patient also will be started on IV hydration with normal  saline with potassium chloride supplement.  For GI prophylaxis, she will  continue on Protonix and for pain management she will be on Oxycodone.  I  expect the patient to be transferred to [facility] within the  next day or so after we made the necessary arrangements for this transfer.      Lajuana Matte, MD  Electronically Signed     MKM/MEDQ  D:  02/10/2006  T:  02/10/2006  Job:  [id number]

## 2011-02-04 NOTE — Discharge Summary (Signed)
NAME:  Elizabeth [name], Elizabeth [name] NO.:  [id number]   MEDICAL RECORD NO.:  [id number]          PATIENT TYPE:  INP   LOCATION:  1318                         FACILITY:  [facility]   PHYSICIAN:  Lajuana Matte, MD  DATE OF BIRTH:  06-10-1966   DATE OF ADMISSION:  03/28/2005  DATE OF DISCHARGE:  04/08/2005                                 DISCHARGE SUMMARY   DISCHARGE DIAGNOSES:  1.  Acute leukemia.  2.  Acute myeloid leukemia.  3.  Aplastic anemia.  4.  Thrombocytopenia.  5.  Gastroesophageal reflux disease.  6.  Neutropenia.  7.  Active bleeding.   HOSPITAL COURSE:  Elizabeth [name] was admitted on March 28, 2005.  At that time,  she was quite pancytopenic.  She was started on Diflucan, acyclovir and IV  antibiotics.  She was also given 1 pack of platelets immediately and 2 units  of packed red blood cells as she was actively bleeding from her gums.  She  continued to require platelets, and I believe she had a total of 4  transfusions of the same as well as 2 units of packed red blood cells.  CBC  and diff were drawn daily.  She was covered again with antibiotics and  antivirals until her counts began to recover.  At one point, she did have  neutropenic fever.  However, although it was above 100.0 for several hours,  it did not remain there, and did not get over 101.0.  Elizabeth [name]' counts  continued to improve.  On April 07, 2005, WBC was 1.5, hemoglobin 8.8,  hematocrit 24.4, MCV 84, MCHC 36, and RDW at 13.2.   CONSULTATIONS:  None.   FILMS:  Chest film was ordered prior to hospitalization.   PROCEDURES:  1.  IV fluids.  2.  Transfusions.  3.  IV medications.   ADMISSION LABORATORIES:  WBC of 0.4, ANC of 0.0, hemoglobin at 8.4,  hematocrit at 23.0.   DISCHARGE MEDICATIONS:  She was discharged home on the following  medications.  1.  Protonix 40 mg daily.  2.  Avelox 400 mg orally everyday x 5 days.   DIET:  She had no restrictions with her diet.   ACTIVITY:  No  restrictions with activity.   WOUND CARE:  Wound care was not applicable.   DISCHARGE INSTRUCTIONS:  She was to return to see Dr. Si Gaul as  scheduled on April 18, 2005, and she was to have labs drawn at the [facility] on Monday, April 11, 2005.       JB/MEDQ  D:  04/20/2005  T:  04/20/2005  Job:  [id number]

## 2011-02-04 NOTE — Discharge Summary (Signed)
NAME:  Elizabeth [name], JOLLIE NO.:  [id number]   MEDICAL RECORD NO.:  [id number]          PATIENT TYPE:  INP   LOCATION:  1308                         FACILITY:  [facility]   PHYSICIAN:  Lajuana Matte, MD  DATE OF BIRTH:  10-03-1965   DATE OF ADMISSION:  02/21/2005  DATE OF DISCHARGE:  02/25/2005                                 DISCHARGE SUMMARY   REASON FOR ADMISSION:  A 45 year old African American female with a history  of AML presenting with neutropenic fever.   HISTORY:  Elizabeth [name] is a very pleasant, 45 year old, African American female  who was diagnosed with acute myelogenous leukemia in March 2006.  The  patient is status post induction chemotherapy with Idarubicin and  Cytarabine.  She did fine with remission revealed on the repeat bone marrow,  on day 14 after the induction.  The patient had another repeat of the bone  marrow on day 48 and also showed no evidence of leukemia.  She was started  on the first cycle of consolidation chemotherapy with high dose Cytarabine  and last dose was given on Jan 31, 2005.  The patient was discharged from  the hospital, on Feb 04, 2005, after completing the consolidation  chemotherapy.  She was doing fine but failed to show up for her weekly lab  at the [facility].  On the day of admission, she called the  clinic complaining of three days of left ear pain and fever up to 101.  The  patient was taking Tylenol over the weekend with no significant improvement.  She continued to have high fever, and she presented to the [facility], on February 21, 2005, with a high fever of 103.  She was found to have  low white blood count and significant anemia.  Her hemoglobin was 2.9.  The  patient was admitted to [facility] for further evaluation and  management of her neutropenic fever as well as a significant anemia.  On the  day of admission, her temperature was 103, heart rate 155, blood pressure  97/53, and  a respiratory rate of 20.   HOSPITAL COURSE:  The patient was admitted to [facility].  She had  a blood culture performed at the [facility] that later on showed  coagulase negative staph.  The patient was admitted with neutropenic fever.  She also had a urinalysis and culture and sensitivity that were  unremarkable.  She was started on treatment with antibiotic in the form of  cefepime, vancomycin, and she continued on her home medications including  acyclovir and Diflucan.  The patient received several units of blood  transfusion during her hospital course, and her hemoglobin improved from 2.9  to 5.6, on February 22, 2005.  On February 23, 2005, her hemoglobin was 8.4,  hematocrit 23.7.  She also had improvement in her total white blood count,  on February 22, 2005, total white blood count was 1.5 with absolute neutrophil  count of 0.5.  On February 23, 2005, her total white blood count was 2.0 with  absolute neutrophil count of 0.8, and on the day of discharge her total  white blood count was 2.7 with absolute neutrophil count of 1.4.  The  patient continued to have low grade fever during this hospitalization.  The  maximum was in the range of 99.4.  She was doing fine.  Her blood pressure  continued to be on the lower side which is normal for her.  She received IV  fluid in the form of normal saline and received two boluses of fluid during  this admission.  The patient was recovering very nicely and on the day of  discharge, her vitals were stable.  Blood pressure was 90/60, pulse 78,  respiratory rate 18, temperature 98.2, oxygen saturation 100% on room air.  During this admission, she also had a chest x-ray performed, on February 21, 2005, and it showed no evidence of acute infiltrate and no evidence of  active chest disease radiographically.  Because of the blood culture showing  coagulase negative Staphylococcus, the patient will continue on home  vancomycin injections for another  seven days after discharge.   DISCHARGE CONDITION:  Stable.   DISCHARGE DIAGNOSES:  1.  Acute myelogenous leukemia.  2.  Neutropenic fever.  3.  Anemia.  4.  Thrombocytopenia which will be monitored closely on an outpatient basis.   DISCHARGE MEDICATIONS:  1.  Vancomycin 1.5 grams intravenously b.i.d. for a total of seven days.  2.  Diflucan 100 mg p.o. every day x 7 days.  3.  Protonix 40 mg p.o. every day.  4.  Reglan 10 mg p.o. q.6h. as needed for nausea and intestinal dysmotility.   DISCHARGE FOLLOWUP:  1.  Dr. Arbutus Ped on February 28, 2005 at the [facility].  2.  The patient will also have weekly CBC and comprehensive metabolic panel      performed at the [facility].  3.  The patient will be rescheduled for another admission soon for her      second cycle of consolidation chemotherapy.       MKM/MEDQ  D:  02/25/2005  T:  02/25/2005  Job:  [id number]   cc:   Barbette Hair. Arlyce Dice, M.D. [facility]   Lajuana Matte, MD  Fax: [phone]

## 2011-02-04 NOTE — H&P (Signed)
NAME:  JOSI, ROEDIGER NO.:  [id number]   MEDICAL RECORD NO.:  [id number]          PATIENT TYPE:  INP   LOCATION:                               FACILITY:  [facility]   PHYSICIAN:  Valentino Hue. Magrinat, M.D.DATE OF BIRTH:  13-Jan-1966   DATE OF ADMISSION:  DATE OF DISCHARGE:                                HISTORY & PHYSICAL   Elizabeth [name] is a 45 year old [name] woman followed by Dr. Arbutus Ped with  a history of acute myelogenous leukemia. The diagnostic bone marrow was  performed in March of this year and showed acute myeloid leukemia with Auer  rods and aberrant expression of CD7 among other markers. Dr. Arbutus Ped  proceeding to routine induction and a followup bone marrow biopsy in May  showed the patient to have attained remission. Accordingly, he proceeded to  first consolidation with high dose Ara-C and that was begun on May 15 so she  is now on day 21.   Three days ago, the patient started having pain in her left ear and a fever  of 101 degrees. She did not call this weekend to let us know about this but  did call this morning to tell us that she had been taking Tylenol all  weekend with high fevers and was not getting any better. Here in the office,  she was found to have a temperature of 103 degrees and is being admitted for  intravenous antibiotics.   PAST MEDICAL HISTORY:  In addition to being status post Hickman catheter  placement and pancytopenia secondary to the leukemia and its treatment  includes history of depression, history of peptic ulcer disease, history of  cholecystectomy, history of tonsillectomy and adenoidectomy.   ALLERGIES:  She is intolerant of ASPIRIN which causes GI bleed she says.   MEDICATIONS:  Her most recent discharge medications included:  Protonix,  Decadron eye drops for two days, Avelox, acyclovir, Diflucan, and Compazine.   SOCIAL HISTORY:  The patient is single, she has no children. There is no  history of alcohol or tobacco  abuse.   REVIEW OF SYMPTOMS:  Aside from a mild headache and left ear discomfort, she  denies visual changes, jaundice, nausea, vomiting, reflux problems, cough,  phlegm production, pleurisy, shortness of breath, rash, diarrhea or problems  with her urine.   PHYSICAL EXAMINATION:  VITAL SIGNS:  Today in an addition to temperature of  103 degrees, she has a heart rate of 155, blood pressure of 97/53 (it was  98/68 last Friday and 127/72 at the end of May) and a respiratory rate of  20.  HEENT:  The patient has mild strabismus but she can focus well. There is no  jaundice, I cannot see well in either ear secondary to wax. Oropharynx shows  mild coating with what may or may not be thrush coated tongue but no frank  thrush of ulcers.  NECK:  Supple, no peripheral adenopathy.  LUNGS:  No crackles or wheezes.  BREASTS:  Deferred.  HEART:  Regular rate and rhythm. Hickman in place, no tenderness, no  erythema, no swelling  around the Hickman exit site.  ABDOMEN:  Benign with positive bowel sounds. Nontender.  MUSCULOSKELETAL:  No peripheral edema.   LABORATORY DATA:  Pending.   IMPRESSION AND PLAN:  A 45 year old [name] woman with a history of  acute myeloid leukemia made March of 2006 in remission after standard  induction now day 21 from first consolidation with high dose Ara-C  presenting with high dose fever.   The plan is for pan culture and start of broad spectrum antibiotics since  the patient developed a fever on Avelox and has a central catheter in place,  we will also start her on vancomycin. The patient will be instructed to call  for a high fever when the fever develops and not waiting. We will reinforce  with [name] that she needs to call us at the first incidence of fever  instead of trying to cover it with Tylenol, since in her situation, a high  fever could be life threatening.       GCM/MEDQ  D:  02/21/2005  T:  02/21/2005  Job:  [id number]   cc:   Barbette Hair.  Arlyce Dice, M.D. [facility]   Lajuana Matte, MD  Fax: [phone]   Roseanna Rainbow, M.D.

## 2011-02-04 NOTE — Discharge Summary (Signed)
NAME:  Elizabeth [name], Elizabeth [name] NO.:  [id number]   MEDICAL RECORD NO.:  [id number]          PATIENT TYPE:  INP   LOCATION:  1306                         FACILITY:  [facility]   PHYSICIAN:  Firas N. Shadad        DATE OF BIRTH:  11/19/1965   DATE OF ADMISSION:  05/09/2005  DATE OF DISCHARGE:  05/14/2005                                 DISCHARGE SUMMARY   REASON FOR ADMISSION:  A 45 year old African-American female with a history  of AML admitted for third cycle of consolidation chemotherapy with high dose  cytarabine.   HISTORY:  Elizabeth [name] is a very pleasant 45 year old African-American female  who was diagnosed with acute myeloid leukemia in March 2006 status post  induction chemotherapy with idarubicin and cytarabine with no evidence of  leukemia on bone marrow which was performed on day #14 and day #48. The  patient is also status post two cycles of consolidation chemotherapy with  high dose cytarabine given Jan 31, 2005 and March 14, 2005. She tolerated the  chemotherapy fine with the exception of an episode at admission for  significant anemia and neutropenia. She is doing fine without any  significant complaints. On the day of admission, her blood pressure was  106/73 with a pulse of 107, respiratory rate of 20, temperature 100.1 and a  weight of 182 pounds.   LABORATORY DATA:  CBC and CMET performed at the [facility]  revealed a white blood count of 4.7, hemoglobin of 10, hematocrit of 29.4,  platelets of 133 with an ANC of 3.4. Sodium 140, potassium 3.6, BUN of 10,  creatinine 0.6 and a glucose of 105.   HOSPITAL COURSE:  #1.  ACUTE MYELOID LEUKEMIA.  The patient started on high  dose cytarabine with a reduced dose of 2 gm/m2 q.12 hours on day 1, 3 and 5  of her admission. She was also started at the same time on Dexamethasone  0.1% eye drops for prophylaxis. The patient continued to do very well during  her admission with no significant complaints or side  effects from the  chemotherapy. She completed her course of chemotherapy on August 26 a.m. and  was discharged in stable condition.  #2.  ANEMIA.  On May 12, 2005, her hemoglobin dropped to 8.6 and she was  transfused 2 units of packed red blood cells.  #3.  GASTROINTESTINAL PROPHYLAXIS. The patient continued on protonix 40 mg  p.o. daily.  #4.  DEEP VENOUS THROMBOSIS PROPHYLAXIS. She was placed on sequential  compression devices during her admission.   CONDITION ON DISCHARGE:  Stable.   DIAGNOSES:  1.  Acute myeloid leukemia status post chemotherapy.  2.  History of gastroesophageal reflux disease.  3.  Anemia.   DISCHARGE MEDICATIONS:  1.  Avelox 400 mg p.o. daily x14 days.  2.  Diflucan 100 mg p.o. daily x14 days.  3.  Acyclovir 400 mg t.i.d. x14 days.  4.  Protonix 40 mg daily.  5.  Oxycodone 5 mg p.o. q.6 hours p.r.n. pain.  6.  Compazine 10 mg p.o. q.6 hours  p.r.n. nausea and vomiting.  7.  Dexamethasone 0.1% eye drops for 48 hours after discharge.   DISCHARGE ACTIVITIES:  No restrictions.   DISCHARGE DIET:  No restrictions.   FOLLOW UP:  1.  Dr. Arbutus Ped at the [facility] as scheduled.  2.  Patient will have weekly lab work including CBC and LDH performed at the      [facility].     ______________________________  [name], P.A.-C    ______________________________  Blenda Nicely. Shadad    AJ/MEDQ  D:  05/15/2005  T:  05/16/2005  Job:  [id number]

## 2011-02-04 NOTE — Discharge Summary (Signed)
NAME:  Elizabeth [name], Elizabeth [name] NO.:  [id number]   MEDICAL RECORD NO.:  [id number]          PATIENT TYPE:  INP   LOCATION:  0252                         FACILITY:  [facility]   PHYSICIAN:  Lajuana Matte, MD  DATE OF BIRTH:  [birth year]   DATE OF ADMISSION:  01/31/2005  DATE OF DISCHARGE:  02/05/2005                                 DISCHARGE SUMMARY   REASON FOR EVALUATION:  A 45 year old African-American female with history  of acute myelogenous leukemia admitted for consolidation chemotherapy with  high dose ara-C and cytarabine.   HISTORY OF PRESENT ILLNESS:  Elizabeth [name] is a very pleasant 45 year old  African-American female who was diagnosed in March of 2006 with acute  myelogenous leukemia.  The patient had induction chemotherapy with  idarubicin and cytarabine and she tolerated her induction chemotherapy well.  A repeat bone marrow on day #14 as well as day #48 after induction  chemotherapy showed no evidence of leukemia.  The patient was admitted on  01/31/05 for chemotherapy with high dose cytarabine.   PHYSICAL EXAMINATION:  On the day of admission, her blood pressure was  108/61, pulse 104, respiratory rate 22, temperature 99.7.  Oxygen saturation  was 99% on room air.   ADMISSION LABORATORY DATA:  Laboratory data on the day of admission showed  white blood count of 4.7, hemoglobin of 10.2, hematocrit 29.9, platelets  193.  Sodium of 139, potassium 3.5, BUN 6, creatinine 0.5.   HOSPITAL COURSE:  The patient was admitted to the oncology service and was  in hospital.  Had a bone marrow procedure performed on Feb 01, 2005 that  showed no evidence of leukemia.  Patient was started on the day of admission  on the first dose of consolidation chemotherapy with high dose cytarabine  given at a dose of 3 gm/M2 every 12 hours on day #1, #3 and #5 of her  admission.  The patient tolerated her chemotherapy well and her last dose of  chemotherapy was given on 02/05/05 in the  a.m.  During this admission, the  patient continued on Decadron 0.1% eye drops every six hours.  She was also  monitored closely for any neurological abnormalities and she did not have  any during this admission.  The patient was monitored closely during this  admission and her vitals were stable and she will be discharged on 02/05/05  if stable.   PROCEDURE:  Bone marrow biopsy and aspirate of the left superior posterior  iliac crest on Jan 31, 2005.   DISCHARGE DIAGNOSES:  1.  Acute myeloid leukemia.  2.  History of peptic ulcer disease.   DISCHARGE MEDICATIONS:  1.  Protonix 40 mg p.o. daily.  2.  Decadron 0.1% eye drops, one drop in each eye every six hours for 48      hours after discharge.  3.  Avelox 400 mg p.o. daily for two weeks after discharge.  4.  Acyclovir 400 mg p.o. q.8h. x two weeks after discharge.  5.  Diflucan 100 mg p.o. daily x 10 days after discharge.  6.  Compazine 10  mg p.o. q.6h. p.r.n. nausea and vomiting.   DISCHARGE CONDITION:  Stable.   ACTIVITY:  No restrictions.   DISCHARGE DIET:  No restrictions.   FOLLOW-UP PLAN:  Dr. Arbutus Ped in two weeks at the [facility] as  scheduled.  The patient also had scheduled appointment for flush of the  Hickman catheter at the [facility].       MKM/MEDQ  D:  02/04/2005  T:  02/04/2005  Job:  [id number]   cc:   Barbette Hair. Arlyce Dice, M.D. [facility]

## 2011-02-04 NOTE — H&P (Signed)
NAME:  Elizabeth [name], NEU NO.:  [id number]   MEDICAL RECORD NO.:  [id number]          PATIENT TYPE:  INP   LOCATION:  1331                         FACILITY:  [facility]   PHYSICIAN:  Lajuana Matte, MD  DATE OF BIRTH:  [birth year]   DATE OF ADMISSION:  07/13/2005  DATE OF DISCHARGE:                                HISTORY & PHYSICAL   REASON FOR ADMISSION:  This is a 45 year old black female with acute myeloid  leukemia admitted for fourth cycle of consolidation chemotherapy with high-  dose cytarabine.   HISTORY OF PRESENT ILLNESS:  Ms. Elizabeth [name] is a 45 year old African-American  female diagnosed with acute myeloid leukemia in March 2006 with normal  cytogenetics. She is status post induction chemotherapy with idarubicin and  cytarabine given December 15, 2004, without evidence of leukemia on bone marrow  biopsy performed on day #14 and on day #48. She is status post three cycles  of consolidation chemotherapy; #1 given on Jan 31, 2005; #2 on March 14, 2005; and #3 on May 09, 2005. She was admitted after cycle #2 on March 29, 2005, and May 19, 2005, for neutropenia/pancytopenia. A stem cell  transplant is planned to be performed at [facility] under the  care of Dr. Greggory Stallion and she and her siblings have been HLA typed. She is  admitted today for cycle #4 of consolidation chemotherapy, awaiting stem  cell transplant. Overall, she offers no complaints other than some mild back  pain.   REVIEW OF SYSTEMS:  She has had no fever, chills, headache, blurred vision,  or double vision. She denies chest pain, shortness of breath, cough, or  palpitations. She has had no nausea, vomiting, abdominal pain, diarrhea,  constipation, melena, or hematochezia. She has had no dysuria, hematuria,  urgency, or increased frequency.   PAST MEDICAL HISTORY:  Significant for:  1.  AML diagnosed in March 2006.  2.  Gastroesophageal reflux disease.  3.  Status post cholecystectomy.  4.  History of depression.  5.  Status post tonsillectomy and adenoidectomy.   FAMILY HISTORY:  Significant for diabetes mellitus, hypertension, and  coronary artery disease. She had a sister that died in her early 83s with a  heart attack, and a brother who died at age 44 with congestive heart  failure.   SOCIAL HISTORY:  She is single. She has no children. She is currently on  disability. She denies having history of smoking, alcohol, or drug abuse.   She is allergic to ASPIRIN and ACTIFED.   MEDICATIONS:  Her home medications are:  1.  Protonix 40 mg p.o. daily.  2.  Potassium chloride 10 mEq p.o. daily.  3.  Oxycodone 5 mg one to two p.o. q.4-6h. as needed for pain.   PHYSICAL EXAMINATION:  VITAL SIGNS:  Reveal a temperature of 99, pulse of  112, respirations 20, blood pressure 114/81. Her height is 65 inches with a  weight of 172.8 pounds.  GENERAL APPEARANCE:  This is a pleasant 45 year old African-American female  who is awake, alert, and oriented in no acute distress.  HEENT:  Normocephalic, atraumatic. Oropharynx was clear.  NECK:  Supple without lymphadenopathy.  CHEST:  Entirely clear to auscultation without wheezes, rales, rhonchi, or  dullness to percussion.  CARDIOVASCULAR:  Reveals a normal S1 and S2 with a sinus tachycardia at 112  beats per minute. There are no appreciable murmurs, gallops, or rubs.  ABDOMEN:  Soft, nontender, nondistended, without masses.  EXTREMITIES:  Without edema.   LABORATORY DATA:  Performed at the [facility] on October 23  revealed a white count of 15.9, hemoglobin 11.8, hematocrit of 34.6,  platelets of 473. Sodium was 136, potassium 3.6, chloride 98, CO2 25, BUN of  8, creatinine of 0.5, and a glucose of 107.   ASSESSMENT:  This is a 45 year old African-American female with a history of  acute myeloid leukemia. She is status post three cycles of consolidation  chemotherapy with high-dose cytarabine.   PLAN:  The patient  will be admitted today to [facility] oncology service  for her fourth cycle of consolidation therapy with high dose cytarabine. She  will receive her GI prophylaxis with Protonix 40 mg p.o. daily, as well as  deep venous thrombosis prophylaxis with sequential compression devices.  Planned procedures during this admission will be a Hickman placement and a  bone marrow biopsy and aspirate.     ______________________________  [name], PA.      Lajuana Matte, MD  Electronically Signed    AJ/MEDQ  D:  07/13/2005  T:  07/13/2005  Job:  [id number]

## 2011-02-04 NOTE — Discharge Summary (Signed)
NAME:  Elizabeth [name], Elizabeth [name] NO.:  [id number]   MEDICAL RECORD NO.:  [id number]          PATIENT TYPE:  INP   LOCATION:  1320                         FACILITY:  [facility]   PHYSICIAN:  Lajuana Matte, MD  DATE OF BIRTH:  07/04/1966   DATE OF ADMISSION:  05/19/2005  DATE OF DISCHARGE:  05/30/2005                                 DISCHARGE SUMMARY   REASON FOR ADMISSION:  Neutropenia and pancytopenia.   HISTORY:  [name] is a pleasant 45 year old African-American female  diagnosed with acute myeloid leukemia in March of 2006 with normal  cytogenetics. She is status post induction chemotherapy with idarubicin and  fludarabine chemotherapy given on December 15, 2004 without evidence of  leukemia on bone marrow biopsy performed on day #14 of day #48. She is  status post 3 cycles of consolidation chemotherapy with high dose ARA-C,  first cycle on Jan 31, 2005, cycle #2 on March 14, 2005 and cycle #3 on  May 09, 2005. She was admitted after cycle #2 on July 11 for severe  neutropenia. She is again admitted on August 31 for severe neutropenia and  pancytopenia.   HOSPITAL COURSE:  The patient was admitted to [facility]. She  developed fever and further neutropenia and anemia. Blood cultures on 2  occasions were positive for Gram positive cocci, coagulase negative. She was  started on vancomycin. All cultures have been drawn from the Hickman. Her  most recent set of cultures from the Hickman have been negative for the past  5 days. For her neutropenia and anemia, she was supported with red blood  cell and platelet transfusions. During the course of this admission, she  received a total of 5 units of packed red blood cells and 3 units of  platelets. In an effort to salvage her Hickman, she will be continued on  Vancomycin and gentamycin for an additional 7 days post discharge. Should  she develop persistent fever or chills, the Hickman will have to be removed.   CONDITION ON DISCHARGE:  Stable.   DISCHARGE DIAGNOSES:  1.  Acute myelogenous leukemia.  2.  Pancytopenia.  3.  Anemia.  4.  Positive blood cultures coag negative, Gram positive cocci from the      Hickman catheter.   DISCHARGE MEDICATIONS:  1.  Vancomycin 1000 mg IV q.12 h for 7 days.  2.  Gentamycin 450 mg IV q.24 hours x7 days.  3.  Protonix 40 mg orally daily.   FOLLOW UP:  The patient is to return on June 02, 2005 for laboratory  followup at the [facility]. She is to call immediately should  she develop fever greater than 100.5 or chills. She is to keep her scheduled  followup with Lajuana Matte, MD on June 13, 2005 or sooner as  previously instructed.     ______________________________  [name], PA.      Lajuana Matte, MD  Electronically Signed    AJ/MEDQ  D:  05/30/2005  T:  05/31/2005  Job:  [id number]

## 2011-02-04 NOTE — Op Note (Signed)
NAME:  Elizabeth [name], Elizabeth [name]                           ACCOUNT NO.:  [id number]   MEDICAL RECORD NO.:  [id number]                   PATIENT TYPE:  OBV   LOCATION:  0098                                 FACILITY:  [facility]   PHYSICIAN:  Anselm Pancoast. Zachery Dakins, M.D.          DATE OF BIRTH:  July 31, 1966   DATE OF PROCEDURE:  12/29/2003  DATE OF DISCHARGE:                                 OPERATIVE REPORT   PREOPERATIVE DIAGNOSIS:  Chronic cholecystitis.   POSTOPERATIVE DIAGNOSIS:  Chronic cholecystitis.   OPERATION/PROCEDURE:  Laparoscopic cholecystectomy with stones.   SURGEON:  Anselm Pancoast. Zachery Dakins, M.D.   ASSISTANT:  Abigail Miyamoto, M.D.   HISTORY:  Elizabeth [name] is a 45 year old female who I saw in the office last  week after she had been seen by Dr. Renaye Rakers for epigastric pain to the  right upper abdomen, and I saw her.  I could not feel any evidence of any  hernia.  Dr. Parke Simmers questioned whether she had a hernia, and her stools were  guaiac positive.  I called and talked with Dr. Parke Simmers, and it appears that  she had vague pains in the upper abdomen.  We obtained a CT to see if we  could see any evidence of a definite hernia, and I questioned whether this  could be gallbladder symptoms, since it is predominantly right upper  quadrant.  Ultrasound did show gallstones and no evidence of any hernia  noted on the CT of the abdomen.  She is now here for laparoscopic  cholecystectomy.  She is mentally retarded and is managed predominantly by  her mother.   Preoperatively, she was given 3 gm of Unasyn and PAS stockings and taken to  the operative suite.  After induction of general anesthesia with  endotracheal tube and oral tube into the stomach and then the reduction  completed, the abdomen was prepped with Betadine surgical solution and  draped in a sterile manner.  A small incision below the umbilicus was made  with sharp dissection through the fatty tissues.  The linea alba and fascia  identified.  A small opening was made and then carefully entered into the  peritoneal cavity through both layers.  The Hasson cannula was introduced  after a pursestring of Vicryl, and the gallbladder was not acutely inflamed  but it had numerous adhesions around it.  I did sort of look down into the  lower abdomen and could not actually visualize the appendix, but there is no  evidence of any inflammation in the lower abdomen.  The 5 mm ports were  placed by Dr. Magnus Ivan, and I placed a 10 mm port in the subxiphoid area,  and then we grasped the gallbladder, retracted upward, and then carefully  dissected the adhesions from the periomentum, and duodenum from the  gallbladder.  The x-ray after the gallbladder had been grasped and clipped  flush with the neck of the gallbladder after  the [facility] catheter had been  introduced into the cystic duct, and this showed a very dilated common bile  duct with good flow into the duodenum.  There was no evidence of any obvious  stones in the distal common bile duct and the intrahepatic radical spill.  I  removed the catheter and triply clipped the cystic duct.  I asked the  technician to have the radiologist review all of the films and give [place] an  opinion.  The cystic artery had been doubly clipped proximally and singly  distally divided, and then the gallbladder was freed from its bed using  predominantly the hook electrocautery.  Right at the last when we were  freeing up the gallbladder, there was a little bit of bile spill from the  gallbladder, and we placed it in an EndoCatch bag.  The gallbladder fossa  was inspected.  No evidence of any bleeding or bile.  The fluid that had  been used for irrigation had been aspirated, and then we switched the camera  to the upper 10 mm port and withdrew the gallbladder within the bag intact.  The additional figure-of-eight in the fascia was placed with 0 Vicryl, and  these were both tied.  I anesthetized the fascia  with Marcaine, and then the  reinspection showed good hemostasis, no bile,  the fluid aspirated, the 5 mm ports withdrawn, and then the upper 10 mm  withdrawn under direct vision.  The subcutaneous wounds were closed with 4-0  Vicryl.  Benzoin and Steri-Strips on the skin.  The patient tolerated the  procedure nicely and was sent to the recovery room under stable  postoperative condition.                                               Anselm Pancoast. Zachery Dakins, M.D.    WJW/MEDQ  D:  12/29/2003  T:  12/29/2003  Job:  [id number]   cc:   Renaye Rakers, M.D.  [phone] [address], [address]  [place]  [place] [address]  Fax: [phone]

## 2011-05-09 ENCOUNTER — Other Ambulatory Visit: Payer: Self-pay | Admitting: Otolaryngology

## 2011-05-19 ENCOUNTER — Other Ambulatory Visit: Payer: Self-pay | Admitting: Internal Medicine

## 2011-05-19 DIAGNOSIS — Z1231 Encounter for screening mammogram for malignant neoplasm of breast: Secondary | ICD-10-CM

## 2011-05-31 ENCOUNTER — Ambulatory Visit
Admission: RE | Admit: 2011-05-31 | Discharge: 2011-05-31 | Disposition: A | Discharge status: Home / Self Care | Payer: Medicare Other | Source: Ambulatory Visit | Attending: Internal Medicine | Admitting: Internal Medicine

## 2011-05-31 DIAGNOSIS — Z1231 Encounter for screening mammogram for malignant neoplasm of breast: Secondary | ICD-10-CM

## 2012-03-29 ENCOUNTER — Other Ambulatory Visit: Payer: Self-pay

## 2012-08-06 ENCOUNTER — Other Ambulatory Visit: Payer: Self-pay | Admitting: Internal Medicine

## 2012-08-06 DIAGNOSIS — Z1231 Encounter for screening mammogram for malignant neoplasm of breast: Secondary | ICD-10-CM

## 2012-08-08 ENCOUNTER — Emergency Department (HOSPITAL_COMMUNITY)
Admission: EM | Admit: 2012-08-08 | Discharge: 2012-08-08 | Disposition: A | Discharge status: Home / Self Care | Payer: Medicare Other | Source: Home / Self Care

## 2012-08-08 ENCOUNTER — Encounter (HOSPITAL_COMMUNITY): Payer: Self-pay

## 2012-08-08 DIAGNOSIS — J9801 Acute bronchospasm: Secondary | ICD-10-CM

## 2012-08-08 DIAGNOSIS — J069 Acute upper respiratory infection, unspecified: Secondary | ICD-10-CM

## 2012-08-08 DIAGNOSIS — R0982 Postnasal drip: Secondary | ICD-10-CM

## 2012-08-08 MED ORDER — ALBUTEROL SULFATE HFA 108 (90 BASE) MCG/ACT IN AERS: 2.0000 | INHALATION_SPRAY | RESPIRATORY_TRACT | Status: DC | PRN

## 2012-08-08 NOTE — ED Provider Notes (Signed)
Medical screening examination/treatment/procedure(s) were performed by non-physician practitioner and as supervising physician I was immediately available for consultation/collaboration.  Edward Trevino   Ayomide Zuleta, MD 08/08/12 1325 

## 2012-08-08 NOTE — ED Provider Notes (Signed)
History     CSN: [id number]  Arrival date & time 08/08/12  1017   None     Chief Complaint  Patient presents with  . Facial Pain    (Consider location/radiation/quality/duration/timing/severity/associated sxs/prior treatment) HPI Comments: 46 year old female presents with chief complaints of cough and chest wall pain when coughing. She also has a sore throat and nasal congestion. The symptoms started approximately one week ago. She denies earache or fever or shortness of breath.   History reviewed. No pertinent past medical history.  History reviewed. No pertinent past surgical history.  History reviewed. No pertinent family history.  History  Substance Use Topics  . Smoking status: Not on file  . Smokeless tobacco: Not on file  . Alcohol Use: Not on file    OB History    Grav Para Term Preterm Abortions TAB SAB Ect Mult Living                  Review of Systems  Constitutional: Negative for fever, chills, activity change, appetite change and fatigue.  HENT: Positive for congestion, sore throat, rhinorrhea and postnasal drip. Negative for facial swelling, neck pain and neck stiffness.   Eyes: Negative.   Respiratory: Positive for cough. Negative for shortness of breath and wheezing.   Cardiovascular: Negative.   Gastrointestinal: Negative.   Genitourinary: Negative.   Skin: Negative for pallor and rash.  Neurological: Negative.     Allergies  Review of patient's allergies indicates no known allergies.  Home Medications   Current Outpatient Rx  Name  Route  Sig  Dispense  Refill  . LORAZEPAM 0.5 MG PO TABS   Oral   Take 0.5 mg by mouth every 8 (eight) hours.         . ALBUTEROL SULFATE HFA 108 (90 BASE) MCG/ACT IN AERS   Inhalation   Inhale 2 puffs into the lungs every 4 (four) hours as needed for wheezing. Dispense with aerochamber   1 Inhaler   0     BP 115/87  Pulse 104  Temp 98.4 F (36.9 C) (Oral)  Resp 22  SpO2 98%  Physical Exam    Constitutional: She is oriented to person, place, and time. She appears well-developed and well-nourished. No distress.  HENT:       Bilateral TMs are pearly gray, translucent no retractions or effusions. Oropharynx with clear PND otherwise clear and moist. No exudates  Neck: Normal range of motion. Neck supple.  Cardiovascular: Normal rate and regular rhythm.   Pulmonary/Chest: Effort normal. No respiratory distress. She has wheezes. She has no rales. She exhibits tenderness.       Faint bilateral wheeze with forced expiration only. Manual pressure/palpation of the lateral ribs reproduces the same pain that she has upon coughing. No pleural friction rub.  Musculoskeletal: Normal range of motion. She exhibits no edema.  Lymphadenopathy:    She has no cervical adenopathy.  Neurological: She is alert and oriented to person, place, and time.  Skin: Skin is warm and dry. No rash noted.  Psychiatric: She has a normal mood and affect.    ED Course  Procedures (including critical care time)  Labs Reviewed - No data to display No results found.   1. URI (upper respiratory infection)   2. PND (post-nasal drip)   3. Bronchospasm   4. Cough due to bronchospasm       MDM  Albuterol HFA 2 puffs every 4 hours when necessary cough and wheeze May continue Mucinex D ,  Add... Claritin 10 mg daily or may choose Allegra for drainage. The drainage is also contributing to the cough Drink plenty of fluids stay well hydrated For any worsening return. If develop shortness of breath worsening cough and/or fever return. Otherwise he may keep appointment with your PCP in the next week or so.        Hayden Rasmussen, NP 08/08/12 1215  Hayden Rasmussen, NP 08/08/12 [phone]

## 2012-08-08 NOTE — ED Notes (Signed)
C/o facial pain, sinus syx

## 2012-09-13 ENCOUNTER — Ambulatory Visit
Admission: RE | Admit: 2012-09-13 | Discharge: 2012-09-13 | Disposition: A | Discharge status: Home / Self Care | Payer: Medicare Other | Source: Ambulatory Visit | Attending: Internal Medicine | Admitting: Internal Medicine

## 2012-09-13 DIAGNOSIS — Z1231 Encounter for screening mammogram for malignant neoplasm of breast: Secondary | ICD-10-CM

## 2013-09-05 ENCOUNTER — Other Ambulatory Visit: Payer: Self-pay

## 2013-09-05 DIAGNOSIS — Z1231 Encounter for screening mammogram for malignant neoplasm of breast: Secondary | ICD-10-CM

## 2013-09-17 ENCOUNTER — Encounter (HOSPITAL_COMMUNITY): Payer: Self-pay | Admitting: Emergency Medicine

## 2013-09-17 ENCOUNTER — Other Ambulatory Visit: Payer: Self-pay

## 2013-09-17 ENCOUNTER — Emergency Department (HOSPITAL_COMMUNITY): Payer: Medicare Other

## 2013-09-17 ENCOUNTER — Emergency Department (HOSPITAL_COMMUNITY)
Admission: EM | Admit: 2013-09-17 | Discharge: 2013-09-17 | Disposition: A | Discharge status: Nursing Home (Includes ICF) | Payer: Medicare Other | Source: Home / Self Care | Attending: Family Medicine | Admitting: Family Medicine

## 2013-09-17 ENCOUNTER — Inpatient Hospital Stay (HOSPITAL_COMMUNITY)
Admission: EM | Admit: 2013-09-17 | Discharge: 2013-09-20 | DRG: 871 | Disposition: A | Discharge status: Home / Self Care | Payer: Medicare Other | Attending: Internal Medicine | Admitting: Internal Medicine

## 2013-09-17 DIAGNOSIS — E873 Alkalosis: Secondary | ICD-10-CM | POA: Diagnosis not present

## 2013-09-17 DIAGNOSIS — Z79899 Other long term (current) drug therapy: Secondary | ICD-10-CM

## 2013-09-17 DIAGNOSIS — A419 Sepsis, unspecified organism: Secondary | ICD-10-CM | POA: Diagnosis present

## 2013-09-17 DIAGNOSIS — E876 Hypokalemia: Secondary | ICD-10-CM | POA: Diagnosis not present

## 2013-09-17 DIAGNOSIS — R0902 Hypoxemia: Secondary | ICD-10-CM | POA: Diagnosis present

## 2013-09-17 DIAGNOSIS — J189 Pneumonia, unspecified organism: Secondary | ICD-10-CM | POA: Diagnosis present

## 2013-09-17 DIAGNOSIS — Z886 Allergy status to analgesic agent status: Secondary | ICD-10-CM

## 2013-09-17 DIAGNOSIS — N390 Urinary tract infection, site not specified: Secondary | ICD-10-CM | POA: Diagnosis present

## 2013-09-17 DIAGNOSIS — R000 Tachycardia, unspecified: Secondary | ICD-10-CM

## 2013-09-17 DIAGNOSIS — R112 Nausea with vomiting, unspecified: Secondary | ICD-10-CM | POA: Diagnosis not present

## 2013-09-17 DIAGNOSIS — C9201 Acute myeloblastic leukemia, in remission: Secondary | ICD-10-CM | POA: Diagnosis present

## 2013-09-17 DIAGNOSIS — B309 Viral conjunctivitis, unspecified: Secondary | ICD-10-CM | POA: Diagnosis present

## 2013-09-17 DIAGNOSIS — Z8249 Family history of ischemic heart disease and other diseases of the circulatory system: Secondary | ICD-10-CM

## 2013-09-17 DIAGNOSIS — R0602 Shortness of breath: Secondary | ICD-10-CM

## 2013-09-17 HISTORY — DX: Acute myeloblastic leukemia, not having achieved remission: C92.00

## 2013-09-17 LAB — URINALYSIS, ROUTINE W REFLEX MICROSCOPIC
Glucose, UA: NEGATIVE mg/dL
Hgb urine dipstick: NEGATIVE
Ketones, ur: 15 mg/dL — AB
Nitrite: POSITIVE — AB
Protein, ur: 100 mg/dL — AB
Specific Gravity, Urine: 1.025 (ref 1.005–1.030)
Urobilinogen, UA: 4 mg/dL — ABNORMAL HIGH (ref 0.0–1.0)
pH: 6 (ref 5.0–8.0)

## 2013-09-17 LAB — CBC WITH DIFFERENTIAL/PLATELET
Basophils Absolute: 0 10*3/uL (ref 0.0–0.1)
Basophils Relative: 0 % (ref 0–1)
Eosinophils Absolute: 0 10*3/uL (ref 0.0–0.7)
Eosinophils Relative: 0 % (ref 0–5)
HCT: 42.1 % (ref 36.0–46.0)
Hemoglobin: 15.1 g/dL — ABNORMAL HIGH (ref 12.0–15.0)
Lymphocytes Relative: 8 % — ABNORMAL LOW (ref 12–46)
Lymphs Abs: 1.1 10*3/uL (ref 0.7–4.0)
MCH: 31.1 pg (ref 26.0–34.0)
MCHC: 35.9 g/dL (ref 30.0–36.0)
MCV: 86.8 fL (ref 78.0–100.0)
Monocytes Absolute: 0.7 10*3/uL (ref 0.1–1.0)
Monocytes Relative: 6 % (ref 3–12)
Neutro Abs: 11.2 10*3/uL — ABNORMAL HIGH (ref 1.7–7.7)
Neutrophils Relative %: 86 % — ABNORMAL HIGH (ref 43–77)
Platelets: 240 10*3/uL (ref 150–400)
RBC: 4.85 MIL/uL (ref 3.87–5.11)
RDW: 12.4 % (ref 11.5–15.5)
WBC: 13 10*3/uL — ABNORMAL HIGH (ref 4.0–10.5)

## 2013-09-17 LAB — BASIC METABOLIC PANEL
BUN: 10 mg/dL (ref 6–23)
CO2: 36 mEq/L — ABNORMAL HIGH (ref 19–32)
Calcium: 9.6 mg/dL (ref 8.4–10.5)
Chloride: 84 mEq/L — ABNORMAL LOW (ref 96–112)
Creatinine, Ser: 0.73 mg/dL (ref 0.50–1.10)
GFR calc Af Amer: >90 mL/min (ref >90)
GFR calc non Af Amer: >90 mL/min (ref >90)
Glucose, Bld: 130 mg/dL — ABNORMAL HIGH (ref 70–99)
Potassium: 2.5 mEq/L — CL (ref 3.7–5.3)
Sodium: 135 mEq/L — ABNORMAL LOW (ref 137–147)

## 2013-09-17 LAB — URINE MICROSCOPIC-ADD ON

## 2013-09-17 LAB — POCT RAPID STREP A: Streptococcus, Group A Screen (Direct): NEGATIVE

## 2013-09-17 LAB — TROPONIN I: Troponin I: <0.3 ng/mL (ref <0.30)

## 2013-09-17 MED ORDER — HEPARIN SODIUM (PORCINE) 5000 UNIT/ML IJ SOLN
5000.0000 [IU] | Freq: Three times a day (TID) | INTRAMUSCULAR | Status: DC
  Administered 2013-09-17 – 2013-09-20 (×8): 5000 [IU] via SUBCUTANEOUS
  Filled 2013-09-17 (×11): qty 1

## 2013-09-17 MED ORDER — IBUPROFEN 200 MG PO TABS
600.0000 mg | ORAL_TABLET | Freq: Once | ORAL | Status: AC
  Administered 2013-09-17: 600 mg via ORAL
  Filled 2013-09-17 (×2): qty 1

## 2013-09-17 MED ORDER — DEXTROSE 5 % IV SOLN
1.0000 g | Freq: Once | INTRAVENOUS | Status: AC
  Administered 2013-09-17: 1 g via INTRAVENOUS
  Filled 2013-09-17: qty 10

## 2013-09-17 MED ORDER — SODIUM CHLORIDE 0.9 % IV SOLN: Freq: Once | INTRAVENOUS | Status: DC

## 2013-09-17 MED ORDER — SODIUM CHLORIDE 0.9 % IV BOLUS (SEPSIS)
1000.0000 mL | Freq: Once | INTRAVENOUS | Status: AC
  Administered 2013-09-17: 1000 mL via INTRAVENOUS

## 2013-09-17 MED ORDER — POTASSIUM CHLORIDE CRYS ER 20 MEQ PO TBCR
60.0000 meq | EXTENDED_RELEASE_TABLET | Freq: Once | ORAL | Status: AC
  Administered 2013-09-17: 60 meq via ORAL
  Filled 2013-09-17: qty 3

## 2013-09-17 MED ORDER — SODIUM CHLORIDE 0.9 % IJ SOLN
3.0000 mL | Freq: Two times a day (BID) | INTRAMUSCULAR | Status: DC
  Administered 2013-09-17 – 2013-09-20 (×6): 3 mL via INTRAVENOUS

## 2013-09-17 MED ORDER — ACETAMINOPHEN 325 MG PO TABS: 650.0000 mg | ORAL_TABLET | Freq: Four times a day (QID) | ORAL | Status: DC | PRN

## 2013-09-17 MED ORDER — DEXTROSE 5 % IV SOLN
500.0000 mg | Freq: Once | INTRAVENOUS | Status: AC
  Administered 2013-09-17: 500 mg via INTRAVENOUS

## 2013-09-17 MED ORDER — LEVALBUTEROL HCL 0.63 MG/3ML IN NEBU
0.6300 mg | INHALATION_SOLUTION | Freq: Four times a day (QID) | RESPIRATORY_TRACT | Status: AC
  Administered 2013-09-17 – 2013-09-18 (×4): 0.63 mg via RESPIRATORY_TRACT
  Filled 2013-09-17 (×5): qty 3

## 2013-09-17 MED ORDER — ONDANSETRON HCL 4 MG PO TABS: 4.0000 mg | ORAL_TABLET | Freq: Four times a day (QID) | ORAL | Status: DC | PRN

## 2013-09-17 MED ORDER — AZITHROMYCIN 500 MG PO TABS
500.0000 mg | ORAL_TABLET | ORAL | Status: DC
  Administered 2013-09-17: 500 mg via ORAL
  Filled 2013-09-17 (×2): qty 1

## 2013-09-17 MED ORDER — ACETAMINOPHEN 650 MG RE SUPP: 650.0000 mg | Freq: Four times a day (QID) | RECTAL | Status: DC | PRN

## 2013-09-17 MED ORDER — ONDANSETRON HCL 4 MG/2ML IJ SOLN
4.0000 mg | Freq: Four times a day (QID) | INTRAMUSCULAR | Status: DC | PRN
  Administered 2013-09-18: 4 mg via INTRAVENOUS
  Filled 2013-09-17: qty 2

## 2013-09-17 MED ORDER — SODIUM CHLORIDE 0.9 % IV SOLN
INTRAVENOUS | Status: DC
  Administered 2013-09-17: 23:00:00 via INTRAVENOUS

## 2013-09-17 MED ORDER — GUAIFENESIN 100 MG/5ML PO SOLN
5.0000 mL | ORAL | Status: DC | PRN
  Administered 2013-09-17 – 2013-09-18 (×3): 100 mg via ORAL
  Filled 2013-09-17 (×3): qty 5

## 2013-09-17 MED ORDER — POTASSIUM CHLORIDE 10 MEQ/100ML IV SOLN
10.0000 meq | Freq: Once | INTRAVENOUS | Status: AC
  Administered 2013-09-17: 23:00:00 10 meq via INTRAVENOUS
  Filled 2013-09-17: qty 100

## 2013-09-17 MED ORDER — DEXTROSE 5 % IV SOLN
1.0000 g | INTRAVENOUS | Status: DC
  Administered 2013-09-18: 1 g via INTRAVENOUS
  Filled 2013-09-17: qty 10

## 2013-09-17 MED ORDER — ALBUTEROL SULFATE (2.5 MG/3ML) 0.083% IN NEBU
5.0000 mg | INHALATION_SOLUTION | Freq: Once | RESPIRATORY_TRACT | Status: AC
  Administered 2013-09-17: 5 mg via RESPIRATORY_TRACT
  Filled 2013-09-17: qty 6

## 2013-09-17 NOTE — ED Notes (Signed)
Notified hope neese, np

## 2013-09-17 NOTE — Progress Notes (Signed)
ANTIBIOTIC CONSULT NOTE - INITIAL  Pharmacy Consult for Renal adjustment of antibiotics.  Indication: rule out pneumonia  Allergies  Allergen Reactions  . Aspirin Other (See Comments)    Causes bleeding from ulcers    Patient Measurements: Height: 5\' 2"  (157.5 cm) Weight: 180 lb 3.2 oz (81.738 kg) (scale b) IBW/kg (Calculated) : 50.1  Vital Signs: Temp: 98.4 F (36.9 C) (12/30 2108) Temp src: Oral (12/30 2108) BP: 109/74 mmHg (12/30 2108) Pulse Rate: 130 (12/30 2108) Intake/Output from previous day:   Intake/Output from this shift:    Labs:  Recent Labs  09/17/13 1744  WBC 13.0*  HGB 15.1*  PLT 240  CREATININE 0.73   Estimated Creatinine Clearance: 87.9 ml/min (by C-G formula based on Cr of 0.73). No results found for this basename: VANCOTROUGH, VANCOPEAK, VANCORANDOM, GENTTROUGH, GENTPEAK, GENTRANDOM, TOBRATROUGH, TOBRAPEAK, TOBRARND, AMIKACINPEAK, AMIKACINTROU, AMIKACIN,  in the last 72 hours   Microbiology: No results found for this or any previous visit (from the past 720 hour(s)).  Medical History: Past Medical History  Diagnosis Date  . AML (acute myeloblastic leukemia) 2007    Medications:  Prescriptions prior to admission  Medication Sig Dispense Refill  . Dextromethorphan-Guaifenesin (MUCINEX FAST-MAX DM MAX) 5-100 MG/5ML LIQD Take 30 mLs by mouth 2 (two) times daily as needed (for cold).      Marland Kitchen guaiFENesin (MUCINEX) 600 MG 12 hr tablet Take 600 mg by mouth 2 (two) times daily as needed for cough or to loosen phlegm.      . Homeopathic Products (EARACHE DROPS) SOLN Place 2 drops in ear(s) daily as needed (for ear pain).      . LORazepam (ATIVAN) 0.5 MG tablet Take 0.5 mg by mouth daily as needed for anxiety.        Assessment: 47 yo F admitted 09/17/2013 with URI.  Pharmacy consulted to initiate adjust antibiotics for renal function.  ID: r/o PNA 12/30 Azith, ceftriaxone  Goal of Therapy:  Renal adjustment of antibiotics.   Plan:   Ceftriaxone and azithromycin initiated at doses appropriate for CAP.  No further renal adjustment required.  Pharmacy will sign off, please call with questions.   Thank you for allowing pharmacy to be a part of this patients care team.  Lovenia Kim Pharm.D., BCPS Clinical Pharmacist 09/17/2013 10:00 PM Pager: [phone] Phone: [phone]

## 2013-09-17 NOTE — ED Notes (Signed)
C/o fever, coughing, significant coughing that results in vomiting per patient, eyes red, sore throat onset of symptoms 2 days ago

## 2013-09-17 NOTE — ED Provider Notes (Signed)
CSN: [id number]     Arrival date & time 09/17/13  1506 History   First MD Initiated Contact with Patient 09/17/13 1710     Chief Complaint  Patient presents with  . URI  . Cough   (Consider location/radiation/quality/duration/timing/severity/associated sxs/prior Treatment) HPI  47 year old female with cough and shortness of breath. Symptom onset approximately 2 days ago and progressively worsening. Abdominal soreness which he coughs. Subjective fever. Cough is nonproductive. Nausea and vomiting yesterday. Nonbilious nonbloody. Mother recently with URI type symptoms. Patient went to urgent care prior to arrival and was referred to the ED after an a chest x-ray which showed pneumonia issues notably tachycardic and had mild hypoxia. Past history significant for AML. No active treatment. Thought to be in remission. Had flu shot. PCP Dr Greggory Stallion in [facility].   Past Medical History  Diagnosis Date  . AML (acute myeloblastic leukemia) 2007   History reviewed. No pertinent past surgical history. History reviewed. No pertinent family history. History  Substance Use Topics  . Smoking status: Never Smoker   . Smokeless tobacco: Not on file  . Alcohol Use: No   OB History   Grav Para Term Preterm Abortions TAB SAB Ect Mult Living                 Review of Systems  All systems reviewed and negative, other than as noted in HPI.   Allergies  Aspirin  Home Medications   Current Outpatient Rx  Name  Route  Sig  Dispense  Refill  . albuterol (PROVENTIL HFA;VENTOLIN HFA) 108 (90 BASE) MCG/ACT inhaler   Inhalation   Inhale 2 puffs into the lungs every 4 (four) hours as needed for wheezing. Dispense with aerochamber   1 Inhaler   0   . Fexofenadine HCl (MUCINEX ALLERGY PO)   Oral   Take by mouth.         Marland Kitchen LORazepam (ATIVAN) 0.5 MG tablet   Oral   Take 0.5 mg by mouth every 8 (eight) hours.          BP 116/75  Pulse 141  Temp(Src) 99.5 F (37.5 C) (Oral)  Resp 16  SpO2  95%  LMP 09/07/2013 Physical Exam  Nursing note and vitals reviewed. Constitutional: She appears well-developed and well-nourished. No distress.  HENT:  Head: Normocephalic and atraumatic.  Eyes: Conjunctivae are normal. Right eye exhibits no discharge. Left eye exhibits no discharge.  Neck: Neck supple.  Cardiovascular: Regular rhythm and normal heart sounds.  Exam reveals no gallop and no friction rub.   No murmur heard. Tachycardic  Pulmonary/Chest: No respiratory distress.  Tachypnea. Speaking in almost complete sentences. Faint wheezing bilaterally with some rhonchi at the left base  Abdominal: Soft. She exhibits no distension. There is no tenderness.  Musculoskeletal: She exhibits no edema and no tenderness.  Neurological: She is alert.  Skin: Skin is warm and dry.  Psychiatric: She has a normal mood and affect. Her behavior is normal. Thought content normal.    ED Course  Procedures (including critical care time) Labs Review Labs Reviewed  CBC WITH DIFFERENTIAL - Abnormal; Notable for the following:    WBC 13.0 (*)    Hemoglobin 15.1 (*)    Neutrophils Relative % 86 (*)    Neutro Abs 11.2 (*)    Lymphocytes Relative 8 (*)    All other components within normal limits  BASIC METABOLIC PANEL - Abnormal; Notable for the following:    Sodium 135 (*)    Potassium  2.5 (*)    Chloride 84 (*)    CO2 36 (*)    Glucose, Bld 130 (*)    All other components within normal limits  URINALYSIS, ROUTINE W REFLEX MICROSCOPIC - Abnormal; Notable for the following:    Color, Urine AMBER (*)    Bilirubin Urine MODERATE (*)    Ketones, ur 15 (*)    Protein, ur 100 (*)    Urobilinogen, UA 4.0 (*)    Nitrite POSITIVE (*)    Leukocytes, UA SMALL (*)    All other components within normal limits  URINE MICROSCOPIC-ADD ON - Abnormal; Notable for the following:    Bacteria, UA MANY (*)    All other components within normal limits  CBC - Abnormal; Notable for the following:    WBC 11.7  (*)    All other components within normal limits  COMPREHENSIVE METABOLIC PANEL - Abnormal; Notable for the following:    Potassium 3.2 (*)    Chloride 91 (*)    CO2 33 (*)    Glucose, Bld 135 (*)    Albumin 2.7 (*)    All other components within normal limits  BASIC METABOLIC PANEL - Abnormal; Notable for the following:    Chloride 94 (*)    Glucose, Bld 136 (*)    All other components within normal limits  CBC - Abnormal; Notable for the following:    RBC 3.74 (*)    Hemoglobin 11.1 (*)    HCT 33.4 (*)    All other components within normal limits  TSH - Abnormal; Notable for the following:    TSH 0.034 (*)    All other components within normal limits  CULTURE, GROUP A STREP  CULTURE, EXPECTORATED SPUTUM-ASSESSMENT  GRAM STAIN  CULTURE, BLOOD (ROUTINE X 2)  CULTURE, BLOOD (ROUTINE X 2)  INFLUENZA PANEL BY PCR  TROPONIN I  TROPONIN I  TROPONIN I  STREP PNEUMONIAE URINARY ANTIGEN  HEMOGLOBIN A1C  HIV ANTIBODY (ROUTINE TESTING)  MAGNESIUM  BASIC METABOLIC PANEL  LEGIONELLA ANTIGEN, URINE   Imaging Review Dg Chest 2 View (if Patient Has Fever And/or Copd)  09/17/2013   CLINICAL DATA:  Cough for 2 days with nausea and fever  EXAM: CHEST  2 VIEW  COMPARISON:  07/11/2006  FINDINGS: Heart size and mediastinal contours normal. Mild opacity lateral right lung base. Mild but more extensive interstitial infiltrates in the left lower lobe, with a 2 cm rounded area of opacity laterally in the left lower lobe, which is likely consolidated lung related to pneumonia.  IMPRESSION: Findings suggest bilateral lower lobe pneumonia left worse than right. Followup after appropriate therapy is recommended to ensure resolution, particularly of the 2 cm rounded opacity laterally in the left lower lobe.   Electronically Signed   By: Esperanza Heir M.D.   On: 09/17/2013 16:22    EKG Interpretation    Date/Time:  Tuesday September 17 2013 17:20:39 EST Ventricular Rate:  138 PR Interval:  63 QRS  Duration: 122 QT Interval:  351 QTC Calculation: 532 R Axis:   145 Text Interpretation:  Sinus tachycardia Nonspecific intraventricular conduction delay ST depr, consider ischemia, anterolateral lds Confirmed by Caylyn Tedeschi  MD, Brisha Mccabe ([id number]) on 09/17/2013 5:41:02 PM            MDM   1. CAP (community acquired pneumonia)   2. UTI (urinary tract infection)   3. Hypokalemia     47 year old female with fever shortness of breath. Imaging significant for pneumonia. We'll cover for  community acquired pneumonia. Admit.    Raeford Razor, MD 09/19/13 1459

## 2013-09-17 NOTE — ED Notes (Signed)
Pt sent here for eval of cough and URI sx with flu like illness x 2 days

## 2013-09-17 NOTE — ED Notes (Signed)
Lab reports K-2.5, Dr. Juleen China made aware.

## 2013-09-17 NOTE — H&P (Signed)
Date: 09/17/2013               Patient Name:  Elizabeth [name] MRN: [id number]  DOB: 1966-04-14 Age / Sex: 47 y.o., female   PCP: No Pcp Per Patient         Medical Service: Internal Medicine Teaching Service         Attending Physician: Dr. Burns Spain, MD    First Contact: Dr. Claudell Kyle Pager: [phone]  Second Contact: Dr. Dierdre Searles Pager: [phone]       After Hours (After 5p/  First Contact Pager: [phone]  weekends / holidays): Second Contact Pager: [phone]   Chief Complaint: Nausea, Vomiting, Cough  History of Present Illness: Elizabeth [name] is a 47 year old African American female with a PMH of AML in remission since 2007, and anxiety.  She presented to [facility] today with a 2 day history of cough, N/V, subjective fever, sore throat.  She reported her symptoms started with itchy eyes and a sore throat on Sunday night followed by stomach pain cough and subsequent nausea and 2 episodes of NBNB emesis a day for the past 2 days.  She does report that her cough has been productive with green sputum.  She reports she has tried Mucinix and Ibuprofen with some relief.  She reports that her mother has been sick recently and that she lives at home with her mother. She denies any dysuria, flank pain, chills, but does admit some increased urinary frequency lately.  She denies any body aches. She reports she sees Dr. Greggory Stallion, oncology for her Hx of AML, she reports she last saw him in July and reported her AML was still in remission at that time.   Meds: Medications Prior to Admission  Medication Sig Dispense Refill  . Dextromethorphan-Guaifenesin (MUCINEX FAST-MAX DM MAX) 5-100 MG/5ML LIQD Take 30 mLs by mouth 2 (two) times daily as needed (for cold).      Marland Kitchen guaiFENesin (MUCINEX) 600 MG 12 hr tablet Take 600 mg by mouth 2 (two) times daily as needed for cough or to loosen phlegm.      . Homeopathic Products (EARACHE DROPS) SOLN Place 2 drops in ear(s) daily as needed (for ear pain).      . LORazepam  (ATIVAN) 0.5 MG tablet Take 0.5 mg by mouth daily as needed for anxiety.         Allergies: Allergies as of 09/17/2013 - Review Complete 09/17/2013  Allergen Reaction Noted  . Aspirin Other (See Comments) 09/17/2013   Past Medical History  Diagnosis Date  . AML (acute myeloblastic leukemia) 2007   Past Surgical History  Procedure Laterality Date  . Tonsillectomy  1996  . Cholecystectomy  2005   Family History  Problem Relation Age of Onset  . Diabetes Mother   . Hyperlipidemia Mother   . Diabetes Brother   . Hypertension Brother   . Heart attack Father 48  . Heart attack Sister 54   History   Social History  . Marital Status: Single    Spouse Name: N/A    Number of Children: N/A  . Years of Education: N/A   Occupational History  . Not on file.   Social History Main Topics  . Smoking status: Never Smoker   . Smokeless tobacco: Not on file  . Alcohol Use: No  . Drug Use: No  . Sexual Activity: Not on file   Other Topics Concern  . Not on file   Social History Narrative  .  No narrative on file    Review of Systems: Review of Systems  Constitutional: Positive for fever ("99.1") and malaise/fatigue. Negative for chills and weight loss.  HENT: Positive for sore throat. Negative for congestion, ear discharge and ear pain.   Eyes: Positive for discharge and redness. Negative for blurred vision and double vision.  Respiratory: Positive for cough, sputum production (green) and shortness of breath. Negative for hemoptysis and wheezing.   Cardiovascular: Negative for chest pain, palpitations, claudication, leg swelling and PND.  Gastrointestinal: Positive for nausea, vomiting and abdominal pain. Negative for heartburn, diarrhea, constipation and blood in stool.  Genitourinary: Positive for frequency. Negative for dysuria, urgency, hematuria and flank pain.  Musculoskeletal: Negative for back pain and myalgias.  Neurological: Positive for weakness. Negative for  dizziness, focal weakness and headaches.     Physical Exam: Blood pressure 109/74, pulse 130, temperature 98.4 F (36.9 C), temperature source Oral, resp. rate 21, height 5\' 2"  (1.575 m), weight 180 lb 3.2 oz (81.738 kg), last menstrual period 09/07/2013, SpO2 96.00%. Tmax 101.1 Physical Exam  Nursing note and vitals reviewed. Constitutional: She is oriented to person, place, and time. She appears well-developed and well-nourished. No distress.  HENT:  Head: Normocephalic and atraumatic.  Eyes: EOM are normal. Pupils are equal, round, and reactive to light. Right eye exhibits discharge. Left eye exhibits discharge. Right conjunctiva is injected. Left conjunctiva is injected.  Right eye extropia  Neck: Neck supple.  Cardiovascular: Regular rhythm and normal heart sounds.  Tachycardia present.   No murmur heard. Pulmonary/Chest: Effort normal. No respiratory distress. She has no wheezes. She exhibits no tenderness.  Bibasilar crackles  Abdominal: Soft. Bowel sounds are normal. She exhibits no distension. There is no tenderness. There is no rebound, no guarding and no CVA tenderness.  Musculoskeletal: She exhibits no edema.  Neurological: She is alert and oriented to person, place, and time.  Skin: She is not diaphoretic.  Psychiatric: She has a normal mood and affect. Her behavior is normal. Judgment and thought content normal.     Lab results: Basic Metabolic Panel:  Recent Labs  16/10/96 1744  NA 135*  K 2.5*  CL 84*  CO2 36*  GLUCOSE 130*  BUN 10  CREATININE 0.73  CALCIUM 9.6  AG: 15  CBC:  Recent Labs  09/17/13 1744  WBC 13.0*  NEUTROABS 11.2*  HGB 15.1*  HCT 42.1  MCV 86.8  PLT 240   Urinalysis:  Recent Labs  09/17/13 1741  COLORURINE AMBER*  LABSPEC 1.025  PHURINE 6.0  GLUCOSEU NEGATIVE  HGBUR NEGATIVE  BILIRUBINUR MODERATE*  KETONESUR 15*  PROTEINUR 100*  UROBILINOGEN 4.0*  NITRITE POSITIVE*  LEUKOCYTESUR SMALL*    Imaging results:  Dg  Chest 2 View (if Patient Has Fever And/or Copd)  09/17/2013   CLINICAL DATA:  Cough for 2 days with nausea and fever  EXAM: CHEST  2 VIEW  COMPARISON:  07/11/2006  FINDINGS: Heart size and mediastinal contours normal. Mild opacity lateral right lung base. Mild but more extensive interstitial infiltrates in the left lower lobe, with a 2 cm rounded area of opacity laterally in the left lower lobe, which is likely consolidated lung related to pneumonia.  IMPRESSION: Findings suggest bilateral lower lobe pneumonia left worse than right. Followup after appropriate therapy is recommended to ensure resolution, particularly of the 2 cm rounded opacity laterally in the left lower lobe.   Electronically Signed   By: Esperanza Heir M.D.   On: 09/17/2013 16:22  Other results: EKG: Sinus Tachycardia, Normal Axis, questionable ST depression in inferior leads, no previous EKG available for comparison.  Assessment & Plan by Problem: Elizabeth [name] is a 47 year old female with a PMH of AML in remission, she is admitted for sepsis due to community acquired pneumonia.    Sepsis -Meets criteria due to temperature of 101.1, Leukocytosis of 13, Tachycardia, and Tachypnea.  Suspected source CAP and UTI. - Empiric Antibiotics with Azithromycin and Ceftriaxone. - IVF - Will obtain blood cultures although empiric antibiotics have already been started in ED.  Community acquired pneumonia meeting criteria for   Patient presented with productive cough, SOB, subjective fevers.  Would found to have B/L lower lobe infiltrates.  - Obtain HIV, Urine Legionella, Urine Strep, A1C (given strong family history and blood glucose of 130 reportly fasting), Influenza panel - Continue Azithromycin and Ceftriaxone for empiric coverage. -Port score 55. CURB-65- 0.    Urinary Tract Infection -Ceftriaxone given for CAP coverage will cover for patient's UTI. -  Patient does have a history of pyelonephritis and admitted subjective fever  and some nausea and vomiting, she does not have any back pain for CVA tenderness at this time. I do not believe she has pyelonephritis at this time.    Hypokalemia - K+ 2.5, Repleated in ED with KDur -Check Mg - Continue to monitor  Metabolic Alkalosis  -Bicarb 36. Likely secondary to emesis and poor PO intake.   Chest Tightness -Likely related to CAP/URI. However patient has a strong family history of MI, and questionable ST depression in inferior leads will Cycle Cardiac enzymes and repeat AM EKG. -Xopenex (given Tachycardia) Q6PRN  Conjunctivitis -Affecting B/L eyes, most likely viral and reportedly predated symptoms. -Warm Compress  Hx of Elevated Liver Enzymes -Check liver function with CMP with AM labs  Opacity on CXR -2cm Opacity in LLL. Will need f/u CXR after resolution of PNA.  Elevated Hemoglobin -Hgb 15.1 may represent hemoconcentration from vomiting/ poor PO intake. - Will repeat AM CBC.  Diet: Regular Code Status: Full DVT PPx: Heparin Sq Dispo: Disposition is deferred at this time, awaiting improvement of current medical problems. Anticipated discharge in approximately 2 day(s).   The patient does have a current PCP (Dr. Loma Messing) and does not need an [facility] hospital follow-up appointment after discharge.  The patient does not have transportation limitations that hinder transportation to clinic appointments.  Signed: Carlynn Purl, DO 09/17/2013, 9:35 PM

## 2013-09-17 NOTE — ED Provider Notes (Signed)
Medical screening examination/treatment/procedure(s) were performed by a resident physician or non-physician practitioner and as the supervising physician I was immediately available for consultation/collaboration.  Clementeen Graham, MD    Rodolph Bong, MD 09/17/13 2126

## 2013-09-17 NOTE — ED Provider Notes (Signed)
CSN: [id number]     Arrival date & time 09/17/13  1045 History   First MD Initiated Contact with Patient 09/17/13 1404     Chief Complaint  Patient presents with  . Emesis  . Cough  . Fever   (Consider location/radiation/quality/duration/timing/severity/associated sxs/prior Treatment) Patient is a 47 y.o. female presenting with cough and fever. The history is provided by the patient.  Cough Cough characteristics:  Productive Sputum characteristics:  Green and yellow Severity:  Moderate Onset quality:  Gradual Duration:  2 days Timing:  Sporadic Chronicity:  New Smoker: no   Worsened by:  Nothing tried Associated symptoms: chills, ear fullness, ear pain, eye discharge, fever, rhinorrhea, shortness of breath, sinus congestion and sore throat   Associated symptoms: no chest pain, no headaches, no rash and no wheezing  Diaphoresis: last night.   Fever Associated symptoms: chills, cough, ear pain, rhinorrhea, sore throat and vomiting (with cough)   Associated symptoms: no chest pain, no confusion, no diarrhea, no dysuria, no headaches and no rash    Elizabeth [name] is a 47 y.o. female who presents to the ED with cough, cold and congestion that started 2 days ago. Temp up to 101.1. She complains of palpations but no chest pain. She have been eating and drinking ok. Her past medical history includes lukemia that is in remission. She did have her flu shot this year.  No past medical history on file. No past surgical history on file. No family history on file. History  Substance Use Topics  . Smoking status: Never Smoker   . Smokeless tobacco: Not on file  . Alcohol Use: No   OB History   Grav Para Term Preterm Abortions TAB SAB Ect Mult Living                 Review of Systems  Constitutional: Positive for fever and chills. Diaphoresis: last night.  HENT: Positive for ear pain, rhinorrhea and sore throat. Negative for mouth sores.   Eyes: Positive for photophobia, discharge and  redness. Negative for pain and visual disturbance.  Respiratory: Positive for cough and shortness of breath. Negative for wheezing.   Cardiovascular: Negative for chest pain.  Gastrointestinal: Positive for vomiting (with cough). Negative for abdominal pain and diarrhea.  Genitourinary: Negative for dysuria, urgency, flank pain, vaginal bleeding and vaginal discharge.  Skin: Negative for rash.  Allergic/Immunologic: Immunocompromised state: hx of lukemia.  Neurological: Negative for dizziness, seizures, syncope, light-headedness and headaches.  Psychiatric/Behavioral: Negative for confusion. The patient is not nervous/anxious (hx of depression).     Allergies  Aspirin  Home Medications   Current Outpatient Rx  Name  Route  Sig  Dispense  Refill  . Fexofenadine HCl (MUCINEX ALLERGY PO)   Oral   Take by mouth.         Marland Kitchen albuterol (PROVENTIL HFA;VENTOLIN HFA) 108 (90 BASE) MCG/ACT inhaler   Inhalation   Inhale 2 puffs into the lungs every 4 (four) hours as needed for wheezing. Dispense with aerochamber   1 Inhaler   0   . LORazepam (ATIVAN) 0.5 MG tablet   Oral   Take 0.5 mg by mouth every 8 (eight) hours.          BP 108/75  Pulse 139  Temp(Src) 101.1 F (38.4 C) (Oral)  Resp 16  SpO2 95%  LMP 09/07/2013 Physical Exam  Nursing note and vitals reviewed. Constitutional: She is oriented to person, place, and time. She appears well-developed and well-nourished.  HENT:  Head: Normocephalic and atraumatic.  Right Ear: Tympanic membrane normal.  Left Ear: Tympanic membrane normal.  Nose: Rhinorrhea present.  Mouth/Throat: Uvula is midline and mucous membranes are normal. Posterior oropharyngeal erythema present.  Eyes: EOM are normal. Right conjunctiva is injected. Left conjunctiva is injected.  Neck: Neck supple.  Cardiovascular: Tachycardia present.   Pulmonary/Chest: She has decreased breath sounds. She has wheezes.  Abdominal: Soft. There is no tenderness.   Musculoskeletal: Normal range of motion.  Neurological: She is alert and oriented to person, place, and time. No cranial nerve deficit.  Skin: Skin is warm and dry.  Psychiatric: She has a normal mood and affect. Her behavior is normal.   EKG shows sinus Tachycardia 133 and Nonspecific ST and T wave abnormality  ED Course  Procedures    MDM  47 y.o. female with cough, congestion, fever, shortness of breath x 2 days. History of lukemia. O2 SAT 95% BP 108/75  Pulse 139  Temp(Src) 101.1 F (38.4 C) (Oral)  Resp 16  SpO2 95%  LMP 09/07/2013  Lungs with decreased breath sound bilateral. There is wheezing noted on inspiration and expiration. Given the patient PMH and clinical findings at this time her condition indicates need for IV fluids and further screening in the ED.  Discussed with the patient and all questioned fully answered. She agrees with plan of care. Will transfer to the [facility] ED.   [facility] Orlene Och, [place] 09/17/13 [phone]

## 2013-09-18 ENCOUNTER — Encounter (HOSPITAL_COMMUNITY): Payer: Self-pay | Admitting: *Deleted

## 2013-09-18 ENCOUNTER — Ambulatory Visit: Payer: Medicare Other

## 2013-09-18 DIAGNOSIS — N390 Urinary tract infection, site not specified: Secondary | ICD-10-CM | POA: Diagnosis present

## 2013-09-18 DIAGNOSIS — A419 Sepsis, unspecified organism: Secondary | ICD-10-CM | POA: Diagnosis present

## 2013-09-18 DIAGNOSIS — J189 Pneumonia, unspecified organism: Secondary | ICD-10-CM

## 2013-09-18 LAB — COMPREHENSIVE METABOLIC PANEL
ALT: 18 U/L (ref 0–35)
AST: 21 U/L (ref 0–37)
Albumin: 2.7 g/dL — ABNORMAL LOW (ref 3.5–5.2)
Alkaline Phosphatase: 116 U/L (ref 39–117)
BUN: 9 mg/dL (ref 6–23)
CO2: 33 mEq/L — ABNORMAL HIGH (ref 19–32)
Calcium: 9.2 mg/dL (ref 8.4–10.5)
Chloride: 91 mEq/L — ABNORMAL LOW (ref 96–112)
Creatinine, Ser: 0.61 mg/dL (ref 0.50–1.10)
GFR calc Af Amer: >90 mL/min (ref >90)
GFR calc non Af Amer: >90 mL/min (ref >90)
Glucose, Bld: 135 mg/dL — ABNORMAL HIGH (ref 70–99)
Potassium: 3.2 mEq/L — ABNORMAL LOW (ref 3.7–5.3)
Sodium: 138 mEq/L (ref 137–147)
Total Bilirubin: 0.8 mg/dL (ref 0.3–1.2)
Total Protein: 6.9 g/dL (ref 6.0–8.3)

## 2013-09-18 LAB — CBC
HCT: 37.8 % (ref 36.0–46.0)
Hemoglobin: 12.9 g/dL (ref 12.0–15.0)
MCH: 29.8 pg (ref 26.0–34.0)
MCHC: 34.1 g/dL (ref 30.0–36.0)
MCV: 87.3 fL (ref 78.0–100.0)
Platelets: 271 10*3/uL (ref 150–400)
RBC: 4.33 MIL/uL (ref 3.87–5.11)
RDW: 12.5 % (ref 11.5–15.5)
WBC: 11.7 10*3/uL — ABNORMAL HIGH (ref 4.0–10.5)

## 2013-09-18 LAB — BASIC METABOLIC PANEL
BUN: 8 mg/dL (ref 6–23)
CO2: 32 mEq/L (ref 19–32)
Calcium: 8.7 mg/dL (ref 8.4–10.5)
Chloride: 94 mEq/L — ABNORMAL LOW (ref 96–112)
Creatinine, Ser: 0.57 mg/dL (ref 0.50–1.10)
GFR calc Af Amer: >90 mL/min (ref >90)
GFR calc non Af Amer: >90 mL/min (ref >90)
Glucose, Bld: 136 mg/dL — ABNORMAL HIGH (ref 70–99)
Potassium: 4 mEq/L (ref 3.7–5.3)
Sodium: 137 mEq/L (ref 137–147)

## 2013-09-18 LAB — MAGNESIUM: Magnesium: 1.9 mg/dL (ref 1.5–2.5)

## 2013-09-18 LAB — TROPONIN I
Troponin I: <0.3 ng/mL (ref <0.30)
Troponin I: <0.3 ng/mL (ref <0.30)

## 2013-09-18 LAB — HEMOGLOBIN A1C
Hgb A1c MFr Bld: 5.4 % (ref <5.7)
Mean Plasma Glucose: 108 mg/dL (ref <117)

## 2013-09-18 LAB — STREP PNEUMONIAE URINARY ANTIGEN: Strep Pneumo Urinary Antigen: NEGATIVE

## 2013-09-18 LAB — HIV ANTIBODY (ROUTINE TESTING W REFLEX): HIV: NONREACTIVE

## 2013-09-18 LAB — INFLUENZA PANEL BY PCR (TYPE A & B)
H1N1 flu by pcr: NOT DETECTED
Influenza A By PCR: NEGATIVE
Influenza B By PCR: NEGATIVE

## 2013-09-18 MED ORDER — POTASSIUM CHLORIDE CRYS ER 20 MEQ PO TBCR
60.0000 meq | EXTENDED_RELEASE_TABLET | Freq: Once | ORAL | Status: AC
  Administered 2013-09-18: 07:00:00 60 meq via ORAL
  Filled 2013-09-18: qty 3

## 2013-09-18 MED ORDER — POTASSIUM CHLORIDE CRYS ER 20 MEQ PO TBCR
40.0000 meq | EXTENDED_RELEASE_TABLET | Freq: Once | ORAL | Status: AC
  Administered 2013-09-18: 40 meq via ORAL
  Filled 2013-09-18: qty 2

## 2013-09-18 MED ORDER — HYDROCOD POLST-CHLORPHEN POLST 10-8 MG/5ML PO LQCR
5.0000 mL | Freq: Two times a day (BID) | ORAL | Status: DC | PRN
  Administered 2013-09-18 – 2013-09-20 (×3): 5 mL via ORAL
  Filled 2013-09-18 (×3): qty 5

## 2013-09-18 MED ORDER — DOXYCYCLINE HYCLATE 100 MG PO TABS
100.0000 mg | ORAL_TABLET | Freq: Two times a day (BID) | ORAL | Status: DC
  Administered 2013-09-18: 100 mg via ORAL
  Filled 2013-09-18 (×3): qty 1

## 2013-09-18 NOTE — Care Management Note (Addendum)
    Page 1 of 2   09/20/2013     12:45:07 PM   CARE MANAGEMENT NOTE 09/20/2013  Patient:  Elizabeth Elizabeth [name], Elizabeth Elizabeth [name]   Account Number:  [id number]  Date Initiated:  09/18/2013  Documentation initiated by:  [facility]  Subjective/Objective Assessment:   47 year old female with Elizabeth [name] PMH of AML in remission, she is admitted for sepsis due to community acquired pneumonia.// Home with mother     Action/Plan:   Empiric Antibiotics with Azithromycin and Ceftriaxone.  - IVF//Home with self care; PCP   Anticipated DC Date:  09/21/2013   Anticipated DC Plan:  HOME W HOSPICE CARE      DC Planning Services  CM consult      Choice offered to / List presented to:  C-1 Patient        HH arranged  HH-1 RN  HH-10 DISEASE MANAGEMENT      HH agency  [facility]   Status of service:  Completed, signed off Medicare Important Message given?   (If response is "NO", the following Medicare IM given date fields will be blank) Date Medicare IM given:   Date Additional Medicare IM given:    Discharge Disposition:    Per UR Regulation:    If discussed at Long Length of Stay Meetings, dates discussed:    Comments:  09/20/13 1230 Henry Demeritt, RN, BSN, [place] [phone] Due to pt not having qualifing diagnosis to receive Home O2 (and pt not able to pay out-of-pocket); Home Health RN set up to assist with disease management.  Pt offered list of Home Health Agencies and chose [facility] to render services.  Kizzie Furnish, RN of [facility] notified.  No DME needs identified at this time.  09/20/13 1130 Kalianna Verbeke, RN, BSN, [place] [phone] From 09/19/13 SATURATION QUALIFICATIONS: (This note is used to comply with regulatory documentation for home oxygen) Patient Saturations on Room Air at Rest =  91% Patient Saturations on Room Air while Ambulating = 86% DME oxygen ordered for home use with above qualifiing note.  09/20/13 1115 Alexiz Cothran, RN, BSN, NCM [phone] Noted home oxygen order.   PT  ambulated 1511ft RA stat 93%. Rest 95% RA today at 1100.  These sats will not quailfy pt for home O2.  Will continue to mointor for [facility] needs.  09/18/13 Oletta Cohn, RN, BSN, [employer] [phone]  Spoke with pt regarding PCP referral.  Pt states her PCP is on [employer].  NCM found that Dr. Concepcion Elk, [place] is her PCP and she has appt on Jan 6 at 3:45.  Contact info:   Name                         Relation             Home   [place],Carrie              Mother         [phone]

## 2013-09-18 NOTE — Progress Notes (Signed)
Flu PCR ordered by RN and sent to lab by RN upon pt's first arrival to floor. Duplicate Flu PCR ordered by MD. RN called lab to ensure they received initial Flu PCR. Lab verified. Duplicate Flu PCR order D/C'ed by floor RN. Will continue to monitor.

## 2013-09-18 NOTE — Progress Notes (Signed)
MD Andrey Campanile is on unit at this current time to follow up on patient's EKG. MD reviewing EKG that was obtained at 1344 at this time.

## 2013-09-18 NOTE — Progress Notes (Signed)
Subjective: Patient seen and examined at the bedside this morning. She feels better. Her shortness of breath and cough are improving. She denies chest pain, nausea, vomiting, dysuria. Plan was discussed.  Objective: Vital signs in last 24 hours: Filed Vitals:   09/18/13 0123 09/18/13 0617 09/18/13 0850 09/18/13 1032  BP: 102/72 107/83  113/69  Pulse: 122 123  139  Temp: 98.1 F (36.7 C) 98.3 F (36.8 C)  99 F (37.2 C)  TempSrc: Oral Oral  Oral  Resp: 20 20  22   Height:      Weight:  182 lb (82.555 kg)    SpO2: 98% 93% 93% 95%   Weight change:   Intake/Output Summary (Last 24 hours) at 09/18/13 1254 Last data filed at 09/18/13 1140  Gross per 24 hour  Intake 1821.33 ml  Output    775 ml  Net 1046.33 ml   Physical Exam  Nursing note and vitals reviewed.  Constitutional: She is oriented to person, place, and time. She appears well-developed and well-nourished. No distress.  HENT:  Head: Normocephalic and atraumatic.  Eyes: EOM are normal. Pupils are equal, round, and reactive to light. Right eye exhibits discharge. Left eye exhibits discharge. Right conjunctiva is injected. Left conjunctiva is injected.  Neck: Neck supple.  Cardiovascular: Regular rhythm and normal heart sounds. Tachycardia present.  No murmur heard.  Pulmonary/Chest: Effort normal. No respiratory distress. She has no wheezes. She exhibits no tenderness.  Still with some bibasilar crackles  Abdominal: Soft. Bowel sounds are normal. She exhibits no distension. There is no tenderness. There is no rebound, no guarding and no CVA tenderness.  Musculoskeletal: She exhibits no edema.  Neurological: She is alert and oriented to person, place, and time.  Skin: She is not diaphoretic.  Psychiatric: She has a normal mood and affect. Her behavior is normal. Judgment and thought content normal.   Lab Results: Basic Metabolic Panel:  Recent Labs Lab 09/17/13 1744 09/18/13 0531  NA 135* 138  K 2.5* 3.2*  CL  84* 91*  CO2 36* 33*  GLUCOSE 130* 135*  BUN 10 9  CREATININE 0.73 0.61  CALCIUM 9.6 9.2   Liver Function Tests:  Recent Labs Lab 09/18/13 0531  AST 21  ALT 18  ALKPHOS 116  BILITOT 0.8  PROT 6.9  ALBUMIN 2.7*   No results found for this basename: LIPASE, AMYLASE,  in the last 168 hours No results found for this basename: AMMONIA,  in the last 168 hours CBC:  Recent Labs Lab 09/17/13 1744 09/18/13 0531  WBC 13.0* 11.7*  NEUTROABS 11.2*  --   HGB 15.1* 12.9  HCT 42.1 37.8  MCV 86.8 87.3  PLT 240 271   Cardiac Enzymes:  Recent Labs Lab 09/17/13 2214 09/18/13 0531 09/18/13 0935  TROPONINI <0.30 <0.30 <0.30   BNP: No results found for this basename: PROBNP,  in the last 168 hours D-Dimer: No results found for this basename: DDIMER,  in the last 168 hours CBG: No results found for this basename: GLUCAP,  in the last 168 hours Hemoglobin A1C:  Recent Labs Lab 09/17/13 2214  HGBA1C 5.4   Fasting Lipid Panel: No results found for this basename: CHOL, HDL, LDLCALC, TRIG, CHOLHDL, LDLDIRECT,  in the last 168 hours Thyroid Function Tests: No results found for this basename: TSH, T4TOTAL, FREET4, T3FREE, THYROIDAB,  in the last 168 hours Coagulation: No results found for this basename: LABPROT, INR,  in the last 168 hours Anemia Panel: No results found for  this basename: VITAMINB12, FOLATE, FERRITIN, TIBC, IRON, RETICCTPCT,  in the last 168 hours Urine Drug Screen: Drugs of Abuse  No results found for this basename: labopia,  cocainscrnur,  labbenz,  amphetmu,  thcu,  labbarb    Alcohol Level: No results found for this basename: ETH,  in the last 168 hours Urinalysis:  Recent Labs Lab 09/17/13 1741  COLORURINE AMBER*  LABSPEC 1.025  PHURINE 6.0  GLUCOSEU NEGATIVE  HGBUR NEGATIVE  BILIRUBINUR MODERATE*  KETONESUR 15*  PROTEINUR 100*  UROBILINOGEN 4.0*  NITRITE POSITIVE*  LEUKOCYTESUR SMALL*   Misc. Labs:   Micro Results: No results found  for this or any previous visit (from the past 240 hour(s)). Studies/Results: Dg Chest 2 View (if Patient Has Fever And/or Copd)  09/17/2013   CLINICAL DATA:  Cough for 2 days with nausea and fever  EXAM: CHEST  2 VIEW  COMPARISON:  07/11/2006  FINDINGS: Heart size and mediastinal contours normal. Mild opacity lateral right lung base. Mild but more extensive interstitial infiltrates in the left lower lobe, with a 2 cm rounded area of opacity laterally in the left lower lobe, which is likely consolidated lung related to pneumonia.  IMPRESSION: Findings suggest bilateral lower lobe pneumonia left worse than right. Followup after appropriate therapy is recommended to ensure resolution, particularly of the 2 cm rounded opacity laterally in the left lower lobe.   Electronically Signed   By: Esperanza Heir M.D.   On: 09/17/2013 16:22   Medications: I have reviewed the patient's current medications. Scheduled Meds: . azithromycin  500 mg Oral Q24H  . cefTRIAXone (ROCEPHIN)  IV  1 g Intravenous Q24H  . heparin  5,000 Units Subcutaneous Q8H  . levalbuterol  0.63 mg Nebulization Q6H  . sodium chloride  3 mL Intravenous Q12H   Continuous Infusions: . sodium chloride 50 mL/hr at 09/17/13 2249   PRN Meds:.chlorpheniramine-HYDROcodone, guaiFENesin, ondansetron (ZOFRAN) IV, ondansetron Assessment/Plan: Elizabeth [name] is a 47 year old female with a PMH of AML in remission, she is admitted for sepsis due to community acquired pneumonia.   #SIRS - Still tachycardic but afebrile. Mild leukocytosis. Does not appear toxic on exam. Suspected sources include CAP and UTI.  - Continue IV Azithromycin and Ceftriaxone - Continue IVF NS @50cc /hr - Follow up blood cultures > pending  WBC  Date Value Range Status  09/18/2013 11.7* 4.0 - 10.5 K/uL Final  09/17/2013 13.0* 4.0 - 10.5 K/uL Final  06/01/2006 4.8  3.9 - 10.0 10e3/uL Final  05/29/2006 3.0* 3.9 - 10.0 10e3/uL Final  05/25/2006 3.8* 3.9 - 10.0 10e3/uL Final     #Community acquired pneumonia - Patient presented with productive cough, SOB, subjective fevers. Lungs with crackles and CXR with B/L lower lobe infiltrates. Port score 55. CURB-65- 0. Flu panel negative. HIV NR. Urine strep pneumo antigen negative. A1C 5.4 (wnl). - Continue IV Azithromycin and Ceftriaxone  - Follow up urine legionella antigen > In process  #Urinary Tract Infection - UA with many bacteria, few squams, positive for nitrites and LE. Patient complaining of frequency, but not dysuria. Patient does have a history of pyelonephritis, but does not have any back pain or CVA tenderness at this time. - Continue antibiotics as above - Continue to monitor  #Hypokalemia - K= 2.5 on admission, repleated in ED with KDur . This morning improved to 3.2. - Giving Kdur - BMP q12h, next at 1700 - Follow up Mg, added on to previous collection  Potassium  Date Value Range Status  09/18/2013 3.2* 3.7 - 5.3 mEq/L Final     Please note change in reference range.     DELTA CHECK NOTED  09/17/2013 2.5* 3.7 - 5.3 mEq/L Final     Please note change in reference range.     CRITICAL RESULT CALLED TO, READ BACK BY AND VERIFIED WITH:     D HUGHES,RN [id number] 09/17/13 WBOND  06/01/2006 3.6  3.5 - 5.3 mEq/L Final  05/29/2006 3.2* 3.5 - 5.3 mEq/L Final  05/25/2006 3.0* 3.5 - 5.3 mEq/L Final    #Metabolic Alkalosis -Bicarb 36 on admission. Likely secondary to emesis and poor PO intake.  - Continue to monitor  #Chest Tightness - Likely related to CAP/URI. However patient has a strong family history of MI, and questionable ST depression in inferior leads. We cycled cardiac enzymes which were negative x3.  - Follow up repeat EKG - Xopenex (given Tachycardia) Q6h PRN   #Conjunctivitis - Affecting B/L eyes, most likely viral and reportedly predated symptoms.  - Warm Compress  #Hx of Elevated Liver Enzymes - Within normal limits on morning labs as below  Hepatic Function Panel     Component  Value Date/Time   PROT 6.9 09/18/2013 0531   ALBUMIN 2.7* 09/18/2013 0531   AST 21 09/18/2013 0531   ALT 18 09/18/2013 0531   ALKPHOS 116 09/18/2013 0531   BILITOT 0.8 09/18/2013 0531    #Opacity on CXR - 2cm Opacity noted in LLL.  - Will need f/u CXR as an outpatient after resolution of PNA.  #Elevated Hemoglobin - Hgb 15.1 on admission thought to represent hemoconcentration from vomiting/ poor PO intake. Repeat was 12.9. - Will repeat AM CBC.   #Diet: Regular   #Code Status: Full   #DVT PPx: Heparin Sq   Dispo: Disposition is deferred at this time, awaiting improvement of current medical problems.  Anticipated discharge in approximately 1-3 day(s).   The patient does have a current PCP (Dorrene German, MD) and does need an [facility] hospital follow-up appointment after discharge.  The patient does not have transportation limitations that hinder transportation to clinic appointments.  .Services Needed at time of discharge: Y = Yes, Blank = No PT:   OT:   RN:   Equipment:   Other:     LOS: 1 day   Vivi Barrack, MD 09/18/2013, 12:54 PM

## 2013-09-18 NOTE — Progress Notes (Signed)
QT prolongation medication consult:  After reviewing patient's current medications, the only 2 medications I see that can cause QT prolongation are azithromycin and ondansetron.  The effect can also be additive.    Thanks for the consult.

## 2013-09-18 NOTE — Progress Notes (Signed)
Report given to receiving RN. Patient is stable with no verbal complaints and no signs or symptoms of distress or discomfort.  

## 2013-09-18 NOTE — Progress Notes (Signed)
Patient evaluated for community based chronic disease management services with [facility] Care Management Program as a benefit of patient's [facility] Medicare Insurance.  Patient does not present with at diagnosis that is appropriate for services at this time.  [facility] will not engage.  Left contact information and [facility] literature at bedside. Made Inpatient Case Manager aware that [facility] Care Management has evaluated. Of note, [facility] Care Management services does not replace or interfere with any services that are arranged by inpatient case management or social work.  For additional questions or referrals please contact Anibal Henderson BSN RN [facility] Liaison at [phone].

## 2013-09-18 NOTE — Progress Notes (Signed)
Spoke with nurse. Patient's repeat EKG was performed and QTc is now 429. Prolongation on admission EKG likely 2/2 hypokalemia, now resolving.   Vivi Barrack, MD  [email or web address] Pager # [phone] Office # [phone]

## 2013-09-18 NOTE — H&P (Signed)
  Date: 09/18/2013  Patient name: Elizabeth [name]  Medical record number: [id number]  Date of birth: [birth year]   I have seen and evaluated Mercie Eon and discussed their care with the Residency Team. Ms Brandle has been in remission since 2007 for AML. She was admitted for Sepsis 2/2 CAP and a UTI. CXR showed B LL PNA and she was flu negative.   Assessment and Plan: I have seen and evaluated the patient as outlined above. I agree with the formulated Assessment and Plan as detailed in the residents' admission note, with the following changes:   1. Sepsis 2/2 CAP and UTI - She has been placed on Rocephin and Azithro. Cx are pending but were obtained after abs were started. She remains tachy and had T max of 101.1 in the past 24 hours but is symptomatically a bit better and her WBC are trending down. Cont current tx and follow course.    Burns Spain, MD 12/31/20141:21 PM

## 2013-09-18 NOTE — Progress Notes (Signed)
EKG faxed to a MD Evelena Peat per MD Dierdre Searles.

## 2013-09-19 ENCOUNTER — Inpatient Hospital Stay (HOSPITAL_COMMUNITY): Payer: Medicare Other

## 2013-09-19 ENCOUNTER — Encounter (HOSPITAL_COMMUNITY): Payer: Self-pay | Admitting: Radiology

## 2013-09-19 ENCOUNTER — Inpatient Hospital Stay: Payer: Medicare Other

## 2013-09-19 LAB — BASIC METABOLIC PANEL
BUN: 6 mg/dL (ref 6–23)
CO2: 31 mEq/L (ref 19–32)
Calcium: 8.9 mg/dL (ref 8.4–10.5)
Chloride: 98 mEq/L (ref 96–112)
Creatinine, Ser: 0.55 mg/dL (ref 0.50–1.10)
GFR calc Af Amer: >90 mL/min (ref >90)
GFR calc non Af Amer: >90 mL/min (ref >90)
Glucose, Bld: 94 mg/dL (ref 70–99)
Potassium: 3.9 mEq/L (ref 3.7–5.3)
Sodium: 139 mEq/L (ref 137–147)

## 2013-09-19 LAB — CBC
HCT: 33.4 % — ABNORMAL LOW (ref 36.0–46.0)
Hemoglobin: 11.1 g/dL — ABNORMAL LOW (ref 12.0–15.0)
MCH: 29.7 pg (ref 26.0–34.0)
MCHC: 33.2 g/dL (ref 30.0–36.0)
MCV: 89.3 fL (ref 78.0–100.0)
Platelets: 200 10*3/uL (ref 150–400)
RBC: 3.74 MIL/uL — ABNORMAL LOW (ref 3.87–5.11)
RDW: 12.7 % (ref 11.5–15.5)
WBC: 4.8 10*3/uL (ref 4.0–10.5)

## 2013-09-19 LAB — CULTURE, GROUP A STREP

## 2013-09-19 LAB — TSH: TSH: 0.034 u[IU]/mL — ABNORMAL LOW (ref 0.350–4.500)

## 2013-09-19 LAB — PRO B NATRIURETIC PEPTIDE: Pro B Natriuretic peptide (BNP): 48.2 pg/mL (ref 0–125)

## 2013-09-19 MED ORDER — SODIUM CHLORIDE 0.9 % IV SOLN
INTRAVENOUS | Status: DC
  Administered 2013-09-19: 13:00:00 via INTRAVENOUS

## 2013-09-19 MED ORDER — ACETAMINOPHEN 325 MG PO TABS
650.0000 mg | ORAL_TABLET | Freq: Four times a day (QID) | ORAL | Status: DC | PRN
  Administered 2013-09-19: 650 mg via ORAL
  Filled 2013-09-19: qty 2

## 2013-09-19 MED ORDER — AZITHROMYCIN 500 MG PO TABS: 500.0000 mg | ORAL_TABLET | Freq: Every day | ORAL | Status: DC

## 2013-09-19 MED ORDER — IOHEXOL 350 MG/ML SOLN
100.0000 mL | Freq: Once | INTRAVENOUS | Status: AC | PRN
  Administered 2013-09-19: 100 mL via INTRAVENOUS

## 2013-09-19 MED ORDER — LORAZEPAM 0.5 MG PO TABS
0.5000 mg | ORAL_TABLET | Freq: Four times a day (QID) | ORAL | Status: DC | PRN
  Administered 2013-09-19: 0.5 mg via ORAL
  Filled 2013-09-19: qty 1

## 2013-09-19 MED ORDER — LEVOFLOXACIN 750 MG PO TABS
750.0000 mg | ORAL_TABLET | Freq: Every day | ORAL | Status: DC
  Administered 2013-09-19: 09:00:00 750 mg via ORAL
  Filled 2013-09-19: qty 1

## 2013-09-19 MED ORDER — VANCOMYCIN HCL IN DEXTROSE 1-5 GM/200ML-% IV SOLN
1000.0000 mg | Freq: Three times a day (TID) | INTRAVENOUS | Status: DC
  Administered 2013-09-19: 1000 mg via INTRAVENOUS
  Filled 2013-09-19 (×2): qty 200

## 2013-09-19 MED ORDER — LEVOFLOXACIN IN D5W 750 MG/150ML IV SOLN
750.0000 mg | INTRAVENOUS | Status: DC
Start: 2013-09-20 — End: 2013-09-20
  Administered 2013-09-20: 750 mg via INTRAVENOUS
  Filled 2013-09-19: qty 150

## 2013-09-19 MED ORDER — PIPERACILLIN-TAZOBACTAM 3.375 G IVPB
3.3750 g | Freq: Three times a day (TID) | INTRAVENOUS | Status: DC
  Filled 2013-09-19 (×2): qty 50

## 2013-09-19 NOTE — Progress Notes (Signed)
ANTIBIOTIC CONSULT NOTE - INITIAL  Pharmacy Consult for Vancomycin and Zosyn Indication: pneumonia  Allergies  Allergen Reactions  . Aspirin Other (See Comments)    Causes bleeding from ulcers    Patient Measurements: Height: 5\' 2"  (157.5 cm) Weight: 183 lb 9.6 oz (83.28 kg) (scale b) IBW/kg (Calculated) : 50.1 Adjusted Body Weight:   Vital Signs: Temp: 98.1 F (36.7 C) (01/01 1457) Temp src: Oral (01/01 1457) BP: 94/70 mmHg (01/01 1457) Pulse Rate: 128 (01/01 1511) Intake/Output from previous day: 12/31 0701 - 01/01 0700 In: 723 [P.O.:720; I.V.:3] Out: 400 [Urine:400] Intake/Output from this shift: Total I/O In: 384.2 [P.O.:360; I.V.:24.2] Out: 800 [Urine:800]  Labs:  Recent Labs  09/17/13 1744 09/18/13 0531 09/18/13 1633 09/19/13 0551  WBC 13.0* 11.7*  --  4.8  HGB 15.1* 12.9  --  11.1*  PLT 240 271  --  200  CREATININE 0.73 0.61 0.57 0.55   Estimated Creatinine Clearance: 88.9 ml/min (by C-G formula based on Cr of 0.55). No results found for this basename: VANCOTROUGH, VANCOPEAK, VANCORANDOM, [place], [place], GENTRANDOM, [place], TOBRAPEAK, TOBRARND, AMIKACINPEAK, AMIKACINTROU, AMIKACIN,  in the last 72 hours   Microbiology: Recent Results (from the past 720 hour(s))  CULTURE, GROUP A STREP     Status: None   Collection Time    09/17/13  1:56 PM      Result Value Range Status   Specimen Description THROAT   Final   Special Requests NONE   Final   Culture     Final   Value: NO SUSPICIOUS COLONIES, CONTINUING TO HOLD     Performed at [facility]   Report Status PENDING   Incomplete  CULTURE, BLOOD (ROUTINE X 2)     Status: None   Collection Time    09/17/13  9:55 PM      Result Value Range Status   Specimen Description BLOOD RIGHT ARM   Final   Special Requests BOTTLES DRAWN AEROBIC ONLY [facility]   Final   Culture  Setup Time     Final   Value: 09/18/2013 09:01     Performed at [facility]   Culture     Final   Value:         BLOOD CULTURE RECEIVED NO GROWTH TO DATE CULTURE WILL BE HELD FOR 5 DAYS BEFORE ISSUING A FINAL NEGATIVE REPORT     Performed at [facility]   Report Status PENDING   Incomplete  CULTURE, BLOOD (ROUTINE X 2)     Status: None   Collection Time    09/17/13 10:00 PM      Result Value Range Status   Specimen Description BLOOD RIGHT HAND   Final   Special Requests BOTTLES DRAWN AEROBIC ONLY 5CC   Final   Culture  Setup Time     Final   Value: 09/18/2013 09:01     Performed at [facility]   Culture     Final   Value:        BLOOD CULTURE RECEIVED NO GROWTH TO DATE CULTURE WILL BE HELD FOR 5 DAYS BEFORE ISSUING A FINAL NEGATIVE REPORT     Performed at [facility]   Report Status PENDING   Incomplete    Medical History: Past Medical History  Diagnosis Date  . AML (acute myeloblastic leukemia) 2007    Medications:  Prescriptions prior to admission  Medication Sig Dispense Refill  . Dextromethorphan-Guaifenesin (MUCINEX FAST-MAX DM MAX) 5-100 MG/5ML LIQD Take 30 mLs  by mouth 2 (two) times daily as needed (for cold).      Marland Kitchen guaiFENesin (MUCINEX) 600 MG 12 hr tablet Take 600 mg by mouth 2 (two) times daily as needed for cough or to loosen phlegm.      . Homeopathic Products (EARACHE DROPS) SOLN Place 2 drops in ear(s) daily as needed (for ear pain).      . LORazepam (ATIVAN) 0.5 MG tablet Take 0.5 mg by mouth daily as needed for anxiety.        Assessment: 47yof to have antibiotics broadened to Vancomycin and Zosyn for possible failed treatment of PNA. Patient is currently afebrile and WBC have decreased to WNL. CTA concerning for multilobar PNA. - Wt 83kg - CrCl 89 ml/min  Goal of Therapy:  Vancomycin trough level 15-20 mcg/ml  Plan:  1. Vancomycin 1g IV q8h 2. Zosyn 3.375g IV q8h - infuse over 4 hours 3. Monitor renal function, cultures, clinical course and adjust as indicated  Earleen Newport [phone] 09/19/2013,4:09 PM

## 2013-09-19 NOTE — Progress Notes (Signed)
Pt O4x, no complaints of pain or SOB. Pt stat on RA 89% 1l 92%, put Pt on 2l will continue to monitor

## 2013-09-19 NOTE — Progress Notes (Signed)
Subjective: Patient seen and examined at the bedside this morning. She says her shortness of breath and chest pain have improved. She is not nauseous anymore. She is interested in going home. She does endorse some dizziness when walking around. Her resting O2 saturation on room air was 94-95%. We walked together to the nurses desk and back. Repeat O2 saturation after exertion was 86-87%. Patient was asymptomatic. She is persistently tachycardic to 100-120s.  Objective: Vital signs in last 24 hours: Filed Vitals:   09/19/13 0745 09/19/13 0747 09/19/13 0927 09/19/13 0934  BP:   94/65   Pulse:   101   Temp:      TempSrc:      Resp: 18 19 19 19   Height:      Weight:      SpO2: 92% 89% 98% 95%   Weight change: 3 lb 6.4 oz (1.542 kg)  Intake/Output Summary (Last 24 hours) at 09/19/13 1200 Last data filed at 09/19/13 0921  Gross per 24 hour  Intake    480 ml  Output      0 ml  Net    480 ml   Physical Exam  Constitutional: She is oriented to person, place, and time. She appears well-developed and well-nourished. No distress.  HENT:  Head: Normocephalic and atraumatic.  Eyes: EOM are normal. Pupils are equal, round, and reactive to light. Right eye exhibits discharge. Left eye exhibits discharge. Right conjunctiva is injected. Left conjunctiva is injected.  Neck: Neck supple.  Cardiovascular: Regular rhythm and normal heart sounds. Tachycardia present.  No murmur heard.  Pulmonary/Chest: Effort normal. No respiratory distress. She has no wheezes. She exhibits no tenderness.  Still with some soft bibasilar crackles  Abdominal: Soft. Bowel sounds are normal. She exhibits no distension. There is no tenderness. There is no rebound, no guarding and no CVA tenderness.  Musculoskeletal: She exhibits no edema.  Neurological: She is alert and oriented to person, place, and time.  Skin: She is not diaphoretic.  Psychiatric: She has a normal mood and affect. Her behavior is normal. Judgment  and thought content normal.   Lab Results: Basic Metabolic Panel:  Recent Labs Lab 09/18/13 1633 09/19/13 0551  NA 137 139  K 4.0 3.9  CL 94* 98  CO2 32 31  GLUCOSE 136* 94  BUN 8 6  CREATININE 0.57 0.55  CALCIUM 8.7 8.9  MG 1.9  --    Liver Function Tests:  Recent Labs Lab 09/18/13 0531  AST 21  ALT 18  ALKPHOS 116  BILITOT 0.8  PROT 6.9  ALBUMIN 2.7*   No results found for this basename: LIPASE, AMYLASE,  in the last 168 hours No results found for this basename: AMMONIA,  in the last 168 hours CBC:  Recent Labs Lab 09/17/13 1744 09/18/13 0531 09/19/13 0551  WBC 13.0* 11.7* 4.8  NEUTROABS 11.2*  --   --   HGB 15.1* 12.9 11.1*  HCT 42.1 37.8 33.4*  MCV 86.8 87.3 89.3  PLT 240 271 200   Cardiac Enzymes:  Recent Labs Lab 09/17/13 2214 09/18/13 0531 09/18/13 0935  TROPONINI <0.30 <0.30 <0.30   BNP: No results found for this basename: PROBNP,  in the last 168 hours D-Dimer: No results found for this basename: DDIMER,  in the last 168 hours CBG: No results found for this basename: GLUCAP,  in the last 168 hours Hemoglobin A1C:  Recent Labs Lab 09/17/13 2214  HGBA1C 5.4   Fasting Lipid Panel: No results found for  this basename: CHOL, HDL, LDLCALC, TRIG, CHOLHDL, LDLDIRECT,  in the last 168 hours Thyroid Function Tests: No results found for this basename: TSH, T4TOTAL, FREET4, T3FREE, THYROIDAB,  in the last 168 hours Coagulation: No results found for this basename: LABPROT, INR,  in the last 168 hours Anemia Panel: No results found for this basename: VITAMINB12, FOLATE, FERRITIN, TIBC, IRON, RETICCTPCT,  in the last 168 hours Urine Drug Screen: Drugs of Abuse  No results found for this basename: labopia,  cocainscrnur,  labbenz,  amphetmu,  thcu,  labbarb    Alcohol Level: No results found for this basename: ETH,  in the last 168 hours Urinalysis:  Recent Labs Lab 09/17/13 1741  COLORURINE AMBER*  LABSPEC 1.025  PHURINE 6.0    GLUCOSEU NEGATIVE  HGBUR NEGATIVE  BILIRUBINUR MODERATE*  KETONESUR 15*  PROTEINUR 100*  UROBILINOGEN 4.0*  NITRITE POSITIVE*  LEUKOCYTESUR SMALL*   Misc. Labs:   Micro Results: Recent Results (from the past 240 hour(s))  CULTURE, GROUP A STREP     Status: None   Collection Time    09/17/13  1:56 PM      Result Value Range Status   Specimen Description THROAT   Final   Special Requests NONE   Final   Culture     Final   Value: NO SUSPICIOUS COLONIES, CONTINUING TO HOLD     Performed at [facility]   Report Status PENDING   Incomplete   Studies/Results: Dg Chest 2 View (if Patient Has Fever And/or Copd)  09/17/2013   CLINICAL DATA:  Cough for 2 days with nausea and fever  EXAM: CHEST  2 VIEW  COMPARISON:  07/11/2006  FINDINGS: Heart size and mediastinal contours normal. Mild opacity lateral right lung base. Mild but more extensive interstitial infiltrates in the left lower lobe, with a 2 cm rounded area of opacity laterally in the left lower lobe, which is likely consolidated lung related to pneumonia.  IMPRESSION: Findings suggest bilateral lower lobe pneumonia left worse than right. Followup after appropriate therapy is recommended to ensure resolution, particularly of the 2 cm rounded opacity laterally in the left lower lobe.   Electronically Signed   By: Skipper Cliche M.D.   On: 09/17/2013 16:22   Medications: I have reviewed the patient's current medications. Scheduled Meds: . heparin  5,000 Units Subcutaneous Q8H  . levofloxacin  750 mg Oral Daily  . sodium chloride  3 mL Intravenous Q12H   Continuous Infusions: . sodium chloride Stopped (09/18/13 1031)   PRN Meds:.chlorpheniramine-HYDROcodone, guaiFENesin, LORazepam  Assessment/Plan: Elizabeth [name] is a 48 year old female with a PMH of AML in remission, she is admitted for sepsis due to community acquired pneumonia.   #Hypoxia - Patient desaturated to 86-87% on my ambulation. She is 93-94% at rest. She is  persistently tachycardic. Review of telemetry shows episodes of sinus tachycardia into the 140s overnight. She complained of some pleuritic chest pain on admission, though this has resolved. Modified Tandy Gaw is intermediate risk, so patient is at 28% risk of PE. Her renal function is good. She has been on subcutaneous heparin since admission. D-dimer would not be useful in this patient with probable sepsis. - CT angio chest PE protocol to rule out PE - Continue IVF NS @50cc /hr  #Sepsis - Still with tachycardia, 110 during my exam. Patient is afebrile. Leukocytosis has resolved. She does not appear toxic on exam. Suspected sources include CAP and UTI. We are also working her up for PE. -  Transitioning antibiotics to Levaquin 750mg  po to cover CAP and UTI - Continue IVF NS @50cc /hr - Follow up blood cultures > pending  WBC  Date Value Range Status  09/19/2013 4.8  4.0 - 10.5 K/uL Final  09/18/2013 11.7* 4.0 - 10.5 K/uL Final  09/17/2013 13.0* 4.0 - 10.5 K/uL Final  06/01/2006 4.8  3.9 - 10.0 10e3/uL Final  05/29/2006 3.0* 3.9 - 10.0 10e3/uL Final    #Community acquired pneumonia - Patient presented with productive cough, shortness of breath, subjective fevers. Lungs had crackles and CXR showed bialteral lower lobe infiltrates. Port score 55; CURB-65 0. Flu panel negative. HIV NR. Urine strep pneumo antigen negative. A1C 5.4 (wnl). Group A strep culture with no suspicious colonies. - Transitioning antibiotics to Levaquin 750mg  po - Follow up urine legionella antigen > In process  #Urinary tract infection - UA with many bacteria, few squams, positive for nitrites and leukoesterase. Patient complained of frequency on admission, but not dysuria. She is without back pain or CVA tenderness. - Continue antibiotics as above - Continue to monitor  #Hypokalemia - K= 2.5 on admission, repleated with KDur 153mEq. This morning improved to 3.9. Magnesium 1.9. - Daily BMP  Potassium  Date Value Range Status    09/19/2013 3.9  3.7 - 5.3 mEq/L Final     Please note change in reference range.  09/18/2013 4.0  3.7 - 5.3 mEq/L Final     HEMOLYSIS AT THIS LEVEL MAY AFFECT RESULT     Please note change in reference range.     DELTA CHECK NOTED  09/18/2013 3.2* 3.7 - 5.3 mEq/L Final     Please note change in reference range.     DELTA CHECK NOTED  09/17/2013 2.5* 3.7 - 5.3 mEq/L Final     Please note change in reference range.     CRITICAL RESULT CALLED TO, READ BACK BY AND VERIFIED WITH:     D HUGHES,RN [id number] 09/17/13 WBOND  06/01/2006 3.6  3.5 - 5.3 mEq/L Final    #Prolonged QTc - QTc 532 on admission. Likely 2/2 hypokalemia as resolved to 429 after repletion. - Repeat EKG as outpatient  #Metabolic Alkalosis -Bicarb 36 on admission. Likely secondary to emesis and poor PO intake. Bicarb 31 (wnl) today. - Continue to monitor  #Chest Tightness - Likely related to CAP/URI. Since patient has a strong family history of MI with questionable ST depression in inferior leads, we cycled cardiac enzymes which were negative x3. Repeat EKG unchanged. Chest tightness has improved today, but we are also ruling out PE due to persistent tachycardia. - Xopenex (given Tachycardia) Q6h PRN   #Conjunctivitis - Affecting bilateral eyes, most likely viral and reportedly predated symptoms.  - Warm compress  #Opacity on CXR - 2cm Opacity noted in LLL.  - Will need f/u CXR as an outpatient after resolution of pneumonia  #Elevated Hemoglobin - Hgb 15.1 on admission thought to represent hemoconcentration from vomiting/poor PO intake. After fluid resuscitation it has dropped to 11.1. - Follow up am CBC  #Diet - Regular   #Code Status - Full   #DVT PPx - Heparin Sq   Dispo: Disposition is deferred at this time, awaiting improvement of current medical problems.  Anticipated discharge in approximately 1-3 day(s).   The patient does have a current PCP (Philis Fendt, MD) and does need an [facility] hospital follow-up  appointment after discharge.  The patient does not have transportation limitations that hinder transportation to clinic appointments.  .Services Needed at time  of discharge: Y = Yes, Blank = No PT:   OT:   RN:   Equipment:   Other:     LOS: 2 days   Lesly Dukes, MD 09/19/2013, 12:00 PM

## 2013-09-19 NOTE — Progress Notes (Signed)
CT shows multifocal airspace opacities throughout both lungs, which appear progressive compared with recent radiographs and are concerning for multilobar pneumonia. She appears to have failed CAP treatment. We will change her antibiotics to IV Vanc and Zosyn per pharmacy consult.   Patient is still quite tachycardic. There is no PE. She is afebrile. TSH is suppressed (0.03). I am adding on further thyroid function labs (T3, free T4 and free T3) to work this up.  Lesly Dukes, MD  [email or web address] Pager # [phone] Office # [phone]

## 2013-09-19 NOTE — Progress Notes (Signed)
SATURATION QUALIFICATIONS: (This note is used to comply with regulatory documentation for home oxygen)  Patient Saturations on Room Air at Rest =  91% Patient Saturations on Room Air while Ambulating = 86%   Pt walked w/ MD

## 2013-09-20 LAB — LEGIONELLA ANTIGEN, URINE: Legionella Antigen, Urine: NEGATIVE

## 2013-09-20 LAB — T3: T3, Total: 98 ng/dl (ref 80.0–204.0)

## 2013-09-20 LAB — BASIC METABOLIC PANEL
BUN: 7 mg/dL (ref 6–23)
CO2: 30 meq/L (ref 19–32)
Calcium: 8.9 mg/dL (ref 8.4–10.5)
Chloride: 97 mEq/L (ref 96–112)
Creatinine, Ser: 0.56 mg/dL (ref 0.50–1.10)
GFR calc Af Amer: >90 mL/min (ref >90)
GFR calc non Af Amer: >90 mL/min (ref >90)
GLUCOSE: 98 mg/dL (ref 70–99)
POTASSIUM: 3.7 meq/L (ref 3.7–5.3)
SODIUM: 138 meq/L (ref 137–147)

## 2013-09-20 LAB — T3, FREE: T3, Free: 2.7 pg/mL (ref 2.3–4.2)

## 2013-09-20 LAB — T4, FREE: Free T4: 1.32 ng/dL (ref 0.80–1.80)

## 2013-09-20 MED ORDER — LEVOFLOXACIN 750 MG PO TABS: 750.0000 mg | ORAL_TABLET | Freq: Every day | ORAL | Status: AC

## 2013-09-20 MED ORDER — PROMETHAZINE HCL 25 MG PO TABS
12.5000 mg | ORAL_TABLET | ORAL | Status: DC | PRN
  Administered 2013-09-20: 12.5 mg via ORAL
  Filled 2013-09-20: qty 1

## 2013-09-20 MED ORDER — PROMETHAZINE HCL 25 MG/ML IJ SOLN: 12.5000 mg | Freq: Four times a day (QID) | INTRAMUSCULAR | Status: DC | PRN

## 2013-09-20 MED ORDER — HYDROCOD POLST-CHLORPHEN POLST 10-8 MG/5ML PO LQCR: 5.0000 mL | Freq: Two times a day (BID) | ORAL | Status: AC | PRN

## 2013-09-20 MED ORDER — PROMETHAZINE HCL 25 MG PO TABS: 12.5000 mg | ORAL_TABLET | Freq: Four times a day (QID) | ORAL | Status: DC | PRN

## 2013-09-20 MED ORDER — PROMETHAZINE HCL 12.5 MG PO TABS: 12.5000 mg | ORAL_TABLET | ORAL | Status: AC | PRN

## 2013-09-20 NOTE — Discharge Instructions (Signed)
It was a pleasure taking care of you. - Please follow up with your PCP on 1/6 @3 :45pm. He will need to do a repeat EKG (cardiac strip) and assess your breathing and oxygen levels. - I have given you a course of antibiotics to take at home. Please take your Levaquin once a day for 10 more days. - I have also given you some anti-nausea medicine. You nausea is likely a medication side effect and will get better with time and symptomatic treatment. - I am sending you home with oxygen. Please use 1L through the nasal cannula continuously. You PCP will re-evaluate you to determine when you can stop using the oxygen. Unfortunately, your insurance will not pay for this and it will cost you $100. In case this is too expensive, we have arranged to a home health nurse to evaluate you at home over the weekend and make sure you are doing OK off the oxygen. - Please return to the ED if you develop worsening shortness of breath, cough, dizziness, heart palpitations, or fainting.

## 2013-09-20 NOTE — Progress Notes (Signed)
Pt d/c by charge RN.  

## 2013-09-20 NOTE — Discharge Summary (Signed)
Name: Elizabeth [name] MRN: [id number] DOB: 1966/03/04 48 y.o. PCP: Philis Fendt, MD  Date of Admission: 09/17/2013  5:08 PM Date of Discharge: 09/20/2013 Attending Physician: Karren Cobble, MD  Discharge Diagnosis: 1. Sepsis due to undetermined organism without resultant organ failure 2. Community acquired pneumonia 3. Persistent tachycardia 4. Hypoxia 5. Urinary tract infection 6. Nausea 7. Hypokalemia 8. Prolonged QTc 9. Chest tightness 10. Viral conjunctivitis 11. Opacity on CXR  Discharge Medications:   Medication List         chlorpheniramine-HYDROcodone 10-8 MG/5ML Lqcr  Commonly known as:  TUSSIONEX  Take 5 mLs by mouth every 12 (twelve) hours as needed for cough.     EARACHE DROPS Soln  Place 2 drops in ear(s) daily as needed (for ear pain).     guaiFENesin 600 MG 12 hr tablet  Commonly known as:  MUCINEX  Take 600 mg by mouth 2 (two) times daily as needed for cough or to loosen phlegm.     levofloxacin 750 MG tablet  Commonly known as:  LEVAQUIN  Take 1 tablet (750 mg total) by mouth daily.     LORazepam 0.5 MG tablet  Commonly known as:  ATIVAN  Take 0.5 mg by mouth daily as needed for anxiety.     MUCINEX FAST-MAX DM MAX 5-100 MG/5ML Liqd  Generic drug:  Dextromethorphan-Guaifenesin  Take 30 mLs by mouth 2 (two) times daily as needed (for cold).     promethazine 12.5 MG tablet  Commonly known as:  PHENERGAN  Take 1 tablet (12.5 mg total) by mouth every 4 (four) hours as needed for nausea.        Disposition and follow-up:   Elizabeth [name] was discharged from [facility] in Stable condition.  At the hospital follow up visit please address:  1.  Follow up tachycardia, oxygen saturation.  2.  Labs / imaging needed at time of follow-up: EKG for QTc at hospital follow up. CXR after resolution of pna for 2cm opacity noted in LLL. Repeat thyroid studies in 6-8 weeks.  3.  Pending labs/ test needing follow-up: None  Follow-up  Appointments: Follow-up Information   Follow up with Philis Fendt, MD On 09/24/2013. (_0 :45pm. This is your PCP.)    Specialty:  Internal Medicine   Contact information:   [place] [id number] [phone]       Discharge Instructions: Discharge Orders   Future Orders Complete By Expires   Diet - low sodium heart healthy  As directed    Increase activity slowly  As directed       Consultations:  None  Procedures Performed:  Dg Chest 2 View  09/19/2013   CLINICAL DATA:  Near syncopal episode.  EXAM: CHEST  2 VIEW  COMPARISON:  Chest CT 09/19/2013 and chest radiograph 09/17/2013  FINDINGS: Again noted are patchy densities at lung bases, left side greater than right. Few patchy densities in the right mid lung. Heart size is stable and within normal limits. No definite pleural fluid.  IMPRESSION: Bilateral parenchymal lung disease, particularly at the lung bases. Findings are suggestive for a multi focal pneumonia.   Electronically Signed   By: Markus Daft M.D.   On: 09/19/2013 17:01   Dg Chest 2 View (if Patient Has Fever And/or Copd)  09/17/2013   CLINICAL DATA:  Cough for 2 days with nausea and fever  EXAM: CHEST  2 VIEW  COMPARISON:  07/11/2006  FINDINGS: Heart size  and mediastinal contours normal. Mild opacity lateral right lung base. Mild but more extensive interstitial infiltrates in the left lower lobe, with a 2 cm rounded area of opacity laterally in the left lower lobe, which is likely consolidated lung related to pneumonia.  IMPRESSION: Findings suggest bilateral lower lobe pneumonia left worse than right. Followup after appropriate therapy is recommended to ensure resolution, particularly of the 2 cm rounded opacity laterally in the left lower lobe.   Electronically Signed   By: Skipper Cliche M.D.   On: 09/17/2013 16:22   Ct Angio Chest Pe W/cm &/or Wo Cm  09/19/2013   CLINICAL DATA:  Pleuritic chest pain with tachycardia and oxygen  desaturation. Question pulmonary embolism.  EXAM: CT ANGIOGRAPHY CHEST WITH CONTRAST  TECHNIQUE: Multidetector CT imaging of the chest was performed using the standard protocol during bolus administration of intravenous contrast. Multiplanar CT image reconstructions including MIPs were obtained to evaluate the vascular anatomy.  CONTRAST:  172m OMNIPAQUE IOHEXOL 350 MG/ML SOLN  COMPARISON:  Radiographs 09/17/2013.  CT 07/09/2013.  FINDINGS: The pulmonary arteries are well opacified with contrast. There is no evidence of acute pulmonary embolism.  The thoracic aorta appears normal. There are no enlarged mediastinal or hilar lymph nodes. The esophagus is mildly dilated throughout its course and demonstrates mild wall thickening. There is no significant pleural or pericardial effusion.  Multifocal airspace opacities are present throughout both lungs, greatest in the lower lobes and appearing somewhat progressive since the recent radiographs. No distinct residual nodularity is identified. There is no endobronchial lesion.  The visualized upper abdomen is notable for mild biliary dilatation status post cholecystectomy. There is no evidence of adrenal mass or splenomegaly.  Review of the MIP images confirms the above findings.  IMPRESSION: 1. No evidence of acute pulmonary embolism. 2. Multifocal airspace opacities throughout both lungs appear slightly progressive compared with the recent radiographs and are concerning for multilobar pneumonia. 3. No significant pleural effusion or lymphadenopathy. 4. Esophageal dilatation with mild wall thickening ; consider scleroderma or other esophageal dysmotility.   Electronically Signed   By: BCamie PatienceM.D.   On: 09/19/2013 15:03    Admission HPI:  Elizabeth TAYLORis a 48year old African American female with a PMH of AML in remission since 2007, and anxiety. [facility], N/V, subjective fever, sore throat. She reported her  symptoms started with itchy eyes and a sore throat on Sunday night followed by stomach pain cough and subsequent nausea and 2 episodes of NBNB emesis a day for the past 2 days. She does report that her cough has been productive with green sputum. She reports she has tried Mucinix and Ibuprofen with some relief. She reports that her mother has been sick recently and that she lives at home with her mother.  She denies any dysuria, flank pain, chills, but does admit some increased urinary frequency lately. She denies any body aches.  She reports she sees Dr. HMarcell Anger oncology for her Hx of AML, she reports she last saw him in July and reported her AML was still in remission at that time.  Physical Exam:  Blood pressure 109/74, pulse 130, temperature 98.4 F (36.9 C), temperature source Oral, resp. rate 21, height _0  (1.575 m), weight 180 lb 3.2 oz (81.738 kg), last menstrual period 09/07/2013, SpO2 96.00%.  Tmax 101.1  Physical Exam  Nursing note and vitals reviewed.  Constitutional: She is oriented to person, place,  and time. She appears well-developed and well-nourished. No distress.  HENT:  Head: Normocephalic and atraumatic.  Eyes: EOM are normal. Pupils are equal, round, and reactive to light. Right eye exhibits discharge. Left eye exhibits discharge. Right conjunctiva is injected. Left conjunctiva is injected.  Right eye extropia  Neck: Neck supple.  Cardiovascular: Regular rhythm and normal heart sounds. Tachycardia present.  No murmur heard.  Pulmonary/Chest: Effort normal. No respiratory distress. She has no wheezes. She exhibits no tenderness.  Bibasilar crackles  Abdominal: Soft. Bowel sounds are normal. She exhibits no distension. There is no tenderness. There is no rebound, no guarding and no CVA tenderness.  Musculoskeletal: She exhibits no edema.  Neurological: She is alert and oriented to person, place, and time.  Skin: She is not diaphoretic.  Psychiatric: She has a normal mood  and affect. Her behavior is normal. Judgment and thought content normal.    Hospital Course by problem list: Elizabeth [name] is a 48 year old female with a PMH of AML in remission, she is admitted for sepsis due to community acquired pneumonia.   1. Sepsis due to undetermined organism without resultant organ failure - On admission, patient met sepsis criteria due to temperature of 101.1, leukocytosis of 13.0, tachycardia, and tachypnea. Suspected sources include CAP and UTI. After antibiotics and fluid resuscitation, she was afebrile, her leukocytosis resolved, and her tachypnea improved. She did have some persistent tachycardia, steadily improving, see problem below. She did not appear toxic on exam. We ruled out PE on CT angio performed 1/1. Blood cultures > NGTD. Patient was discharged on Levaquin 762m po to cover CAP and UTI, total antibiotic 14 day course.  WBC   Date  Value  Range  Status   09/19/2013  4.8  4.0 - 10.5 K/uL  Final   09/18/2013  11.7*  4.0 - 10.5 K/uL  Final   09/17/2013  13.0*  4.0 - 10.5 K/uL  Final   06/01/2006  4.8  3.9 - 10.0 10e3/uL  Final   05/29/2006  3.0*  3.9 - 10.0 10e3/uL  Final     2. Community acquired pneumonia - Patient presented with productive cough, shortness of breath. Lungs had crackles and CXR showed bilateral lower lobe infiltrates. Port score 55; CURB-65 0. Flu panel negative. HIV NR. Urine strep pneumo antigen negative. A1C 5.4 (wnl). Group A strep culture with no suspicious colonies. CT and repeat CXR performed on 1/1 showed slightly progressive multi focal pneumonia, but she was clinically improving so we did not escalate antibiotics. Suspect atypical pneumonia. Patient was discharged on Levaquin [id number] #10 to cover CAP and UTI, for a total antibiotic course of 14 days.  3. Tachycardia - Patient was tachycardic during her stay, 120-130s, likely secondary to sepsis. Review of telemetry showed persistent sinus tachycardia. Her PR improved to 70-110 on the  morning of discharge. Her TSH was suppressed (0.034) on our lab work, but other thyroid studies (free T4, free T3, total T3) were within normal limits. Recommend follow up EKG at hospital follow up and repeat thyroid labs in 6-8 weeks.  4. Hypoxia - Patient desaturated to 86-87% on my ambulation on 1/1. She was 93-94% at rest on RA, 99% on 1L [place]. No symptoms with her desaturations except perhaps some mild lightheadedness. We ruled out PE with a CT angio chest. Suspect hypoxia is 2/2 multifocal pneumonia which should resolve with the infection. On the nurses evaluation on 1/2, she actually ambulated [id number] RA stat 93%, rest sat  95% RA. Nonetheless, we wanted to give her temporary home O2 due to prior desaturation on exertion. She was ordered home O2 continuous at 1L [place], but unfortunately her insurance would not pay for this with the diagnosis of "pneumonia," and she must self-pay. We have arranged for a home health nurse to visit her over the weekend in the event that she cannot afford the oxygen on her own. Please re-evaluate her oxygen saturation at rest and with exertion at hospital follow up.  5. Urinary tract infection - UA showed many bacteria, few squams, positive for nitrites and leukoesterase. Patient complained of frequency on admission, but not dysuria. She is without back pain or CVA tenderness. Antibiotics were provided as above.  6. Nausea - Patient complained of some nausea and NBNB vomiting on the morning of discharge, which resolved with symptomatic therapy. Could be 2/2 her known UTI vs. medication side effect from the Levaquin. She had a prolonged QTc on admission EKG which has resolved (see problem below), so we avoided Zofran. She was discharged with a script for Phenergan 12.5 mg po q4h prn nausea.  7. Hypokalemia - K= 2.5 on admission, repleated with KDur 146mq. Magnesium 1.9. Trend below.  Potassium   Date  Value  Range  Status   09/20/2013  3.7  3.7 - 5.3 mEq/L  Final     Please note change in reference range.   09/19/2013  3.9  3.7 - 5.3 mEq/L  Final   Please note change in reference range.   09/18/2013  4.0  3.7 - 5.3 mEq/L  Final   HEMOLYSIS AT THIS LEVEL MAY AFFECT RESULT   Please note change in reference range.   DELTA CHECK NOTED   09/18/2013  3.2*  3.7 - 5.3 mEq/L  Final   Please note change in reference range.   DELTA CHECK NOTED   09/17/2013  2.5*  3.7 - 5.3 mEq/L  Final   Please note change in reference range.   CRITICAL RESULT CALLED TO, READ BACK BY AND VERIFIED WITH:   D HUGHES,RN [id number] 09/17/13 WBOND    8. Prolonged QTc - QTc 532 on admission. Likely 2/2 hypokalemia as QTc resolved to 429 after repletion. She will need a repeat EKG as outpatient to follow this up.  9. Chest tightness - Patient with pleuritic chest tightness on admission, likely related to CAP/URI. Since patient has a strong family history of MI, we cycled cardiac enzymes which were negative x3. EKG was not concerning for ACS. We ruled out PE with a CT angiogram. Her symptoms improved with Xopenex (given Tachycardia) Q6h PRN and her home Ativan.  10. Conjunctivitis - Patient appeared to have conjunctivitis affecting bilateral eyes, most likely viral and reportedly predated symptoms. We provided warm compresses with symptomatic improvement.  11. Opacity on CXR - A 2cm opacity was noted in LLL on admission CXR. She will need a f/u CXR as an outpatient after resolution of her pneumonia.   Discharge Vitals:   BP 103/75  Pulse 70  Temp(Src) 99.1 F (37.3 C) (Oral)  Resp 18  Ht _0  (1.575 m)  Wt 184 lb 8 oz (83.689 kg)  BMI 33.74 kg/m2  SpO2 94%  LMP 09/07/2013  Discharge Labs:  Results for orders placed during the hospital encounter of 09/17/13 (from the past 24 hour(s))  PRO B NATRIURETIC PEPTIDE     Status: None   Collection Time    09/19/13  7:50 PM      Result Value Range  Pro B Natriuretic peptide (BNP) 48.2  0 - 125 pg/mL  BASIC METABOLIC PANEL     Status:  None   Collection Time    09/20/13  4:34 AM      Result Value Range   Sodium 138  137 - 147 mEq/L   Potassium 3.7  3.7 - 5.3 mEq/L   Chloride 97  96 - 112 mEq/L   CO2 30  19 - 32 mEq/L   Glucose, Bld 98  70 - 99 mg/dL   BUN 7  6 - 23 mg/dL   Creatinine, Ser 0.56  0.50 - 1.10 mg/dL   Calcium 8.9  8.4 - 10.5 mg/dL   GFR calc non Af Amer >90  >90 mL/min   GFR calc Af Amer >90  >90 mL/min    Signed: Lesly Dukes, MD 09/20/2013, 12:20 PM   Time Spent on Discharge: 40 minutes Services Ordered on Discharge: Home health RN Equipment Ordered on Discharge: Home oxygen continuous 1L [place]

## 2013-09-20 NOTE — Progress Notes (Signed)
Subjective: Patient seen and examined at the bedside this morning. She says her shortness of breath and cough are getting better but still present. She is nauseous this morning and reports 3 episodes of NBNB vomitus. No dizziness, weakness. I spoke with her mother on the phone about the plan.  Objective: Vital signs in last 24 hours: Filed Vitals:   09/19/13 1457 09/19/13 1511 09/19/13 2100 09/20/13 0541  BP: 94/70  114/66 103/75  Pulse: 136 128 116 70  Temp: 98.1 F (36.7 C)  99.1 F (37.3 C) 99.1 F (37.3 C)  TempSrc: Oral  Oral Oral  Resp: 18  17 18   Height:      Weight:    184 lb 8 oz (83.689 kg)  SpO2: 95%  99% 93%   Weight change: 14.4 oz (0.408 kg)  Intake/Output Summary (Last 24 hours) at 09/20/13 0900 Last data filed at 09/20/13 0857  Gross per 24 hour  Intake 944.17 ml  Output   1800 ml  Net -855.83 ml   Physical Exam  Constitutional: She is oriented to person, place, and time. She appears well-developed and well-nourished. No distress.  HENT:  Head: Normocephalic and atraumatic.  Eyes: EOM are normal. Pupils are equal, round, and reactive to light. Right eye exhibits discharge. Left eye exhibits discharge. Right conjunctiva is injected. Left conjunctiva is injected.  Neck: Neck supple.  Cardiovascular: Regular rhythm and normal heart sounds. Tachycardia still present but improving, rate 100.  No murmur heard.  Pulmonary/Chest: Effort normal. No respiratory distress. She has no wheezes. She exhibits no tenderness.  Still with some soft bibasilar crackles  Abdominal: Soft. Bowel sounds are normal. She exhibits no distension. Mild suprapubic tenderness. There is no rebound, no guarding and no CVA tenderness.  Musculoskeletal: She exhibits no edema.  Neurological: She is alert and oriented to person, place, and time.  Skin: She is not diaphoretic.  Psychiatric: She has a normal mood and affect. Her behavior is normal. Judgment and thought content normal.   Lab  Results: Basic Metabolic Panel:  Recent Labs Lab 09/18/13 1633 09/19/13 0551 09/20/13 0434  NA 137 139 138  K 4.0 3.9 3.7  CL 94* 98 97  CO2 32 31 30  GLUCOSE 136* 94 98  BUN 8 6 7   CREATININE 0.57 0.55 0.56  CALCIUM 8.7 8.9 8.9  MG 1.9  --   --    Liver Function Tests:  Recent Labs Lab 09/18/13 0531  AST 21  ALT 18  ALKPHOS 116  BILITOT 0.8  PROT 6.9  ALBUMIN 2.7*   No results found for this basename: LIPASE, AMYLASE,  in the last 168 hours No results found for this basename: AMMONIA,  in the last 168 hours CBC:  Recent Labs Lab 09/17/13 1744 09/18/13 0531 09/19/13 0551  WBC 13.0* 11.7* 4.8  NEUTROABS 11.2*  --   --   HGB 15.1* 12.9 11.1*  HCT 42.1 37.8 33.4*  MCV 86.8 87.3 89.3  PLT 240 271 200   Cardiac Enzymes:  Recent Labs Lab 09/17/13 2214 09/18/13 0531 09/18/13 0935  TROPONINI <0.30 <0.30 <0.30   BNP:  Recent Labs Lab 09/19/13 1950  PROBNP 48.2   D-Dimer: No results found for this basename: DDIMER,  in the last 168 hours CBG: No results found for this basename: GLUCAP,  in the last 168 hours Hemoglobin A1C:  Recent Labs Lab 09/17/13 2214  HGBA1C 5.4   Fasting Lipid Panel: No results found for this basename: CHOL, HDL, LDLCALC, TRIG,  CHOLHDL, LDLDIRECT,  in the last 168 hours Thyroid Function Tests:  Recent Labs Lab 09/19/13 0551  TSH 0.034*  FREET4 1.32  T3FREE 2.7   Coagulation: No results found for this basename: LABPROT, INR,  in the last 168 hours Anemia Panel: No results found for this basename: VITAMINB12, FOLATE, FERRITIN, TIBC, IRON, RETICCTPCT,  in the last 168 hours Urine Drug Screen: Drugs of Abuse  No results found for this basename: labopia,  cocainscrnur,  labbenz,  amphetmu,  thcu,  labbarb    Alcohol Level: No results found for this basename: ETH,  in the last 168 hours Urinalysis:  Recent Labs Lab 09/17/13 1741  COLORURINE AMBER*  LABSPEC 1.025  PHURINE 6.0  GLUCOSEU NEGATIVE  HGBUR NEGATIVE   BILIRUBINUR MODERATE*  KETONESUR 15*  PROTEINUR 100*  UROBILINOGEN 4.0*  NITRITE POSITIVE*  LEUKOCYTESUR SMALL*   Misc. Labs:   Micro Results: Recent Results (from the past 240 hour(s))  CULTURE, GROUP A STREP     Status: None   Collection Time    09/17/13  1:56 PM      Result Value Range Status   Specimen Description THROAT   Final   Special Requests NONE   Final   Culture     Final   Value: No Beta Hemolytic Streptococci Isolated     Performed at [facility]   Report Status 09/19/2013 FINAL   Final  CULTURE, BLOOD (ROUTINE X 2)     Status: None   Collection Time    09/17/13  9:55 PM      Result Value Range Status   Specimen Description BLOOD RIGHT ARM   Final   Special Requests BOTTLES DRAWN AEROBIC ONLY [facility]   Final   Culture  Setup Time     Final   Value: 09/18/2013 09:01     Performed at [facility]   Culture     Final   Value:        BLOOD CULTURE RECEIVED NO GROWTH TO DATE CULTURE WILL BE HELD FOR 5 DAYS BEFORE ISSUING A FINAL NEGATIVE REPORT     Performed at [facility]   Report Status PENDING   Incomplete  CULTURE, BLOOD (ROUTINE X 2)     Status: None   Collection Time    09/17/13 10:00 PM      Result Value Range Status   Specimen Description BLOOD RIGHT HAND   Final   Special Requests BOTTLES DRAWN AEROBIC ONLY 5CC   Final   Culture  Setup Time     Final   Value: 09/18/2013 09:01     Performed at [facility]   Culture     Final   Value:        BLOOD CULTURE RECEIVED NO GROWTH TO DATE CULTURE WILL BE HELD FOR 5 DAYS BEFORE ISSUING A FINAL NEGATIVE REPORT     Performed at [facility]   Report Status PENDING   Incomplete   Studies/Results: Dg Chest 2 View  09/19/2013   CLINICAL DATA:  Near syncopal episode.  EXAM: CHEST  2 VIEW  COMPARISON:  Chest CT 09/19/2013 and chest radiograph 09/17/2013  FINDINGS: Again noted are patchy densities at lung bases, left side greater than right. Few patchy densities in the right  mid lung. Heart size is stable and within normal limits. No definite pleural fluid.  IMPRESSION: Bilateral parenchymal lung disease, particularly at the lung bases. Findings are suggestive for a multi focal pneumonia.   Electronically  Signed   By: Markus Daft M.D.   On: 09/19/2013 17:01   Ct Angio Chest Pe W/cm &/or Wo Cm  09/19/2013   CLINICAL DATA:  Pleuritic chest pain with tachycardia and oxygen desaturation. Question pulmonary embolism.  EXAM: CT ANGIOGRAPHY CHEST WITH CONTRAST  TECHNIQUE: Multidetector CT imaging of the chest was performed using the standard protocol during bolus administration of intravenous contrast. Multiplanar CT image reconstructions including MIPs were obtained to evaluate the vascular anatomy.  CONTRAST:  128mL OMNIPAQUE IOHEXOL 350 MG/ML SOLN  COMPARISON:  Radiographs 09/17/2013.  CT 07/09/2013.  FINDINGS: The pulmonary arteries are well opacified with contrast. There is no evidence of acute pulmonary embolism.  The thoracic aorta appears normal. There are no enlarged mediastinal or hilar lymph nodes. The esophagus is mildly dilated throughout its course and demonstrates mild wall thickening. There is no significant pleural or pericardial effusion.  Multifocal airspace opacities are present throughout both lungs, greatest in the lower lobes and appearing somewhat progressive since the recent radiographs. No distinct residual nodularity is identified. There is no endobronchial lesion.  The visualized upper abdomen is notable for mild biliary dilatation status post cholecystectomy. There is no evidence of adrenal mass or splenomegaly.  Review of the MIP images confirms the above findings.  IMPRESSION: 1. No evidence of acute pulmonary embolism. 2. Multifocal airspace opacities throughout both lungs appear slightly progressive compared with the recent radiographs and are concerning for multilobar pneumonia. 3. No significant pleural effusion or lymphadenopathy. 4. Esophageal dilatation  with mild wall thickening ; consider scleroderma or other esophageal dysmotility.   Electronically Signed   By: Camie Patience M.D.   On: 09/19/2013 15:03   Medications: I have reviewed the patient's current medications. Scheduled Meds: . heparin  5,000 Units Subcutaneous Q8H  . levofloxacin (LEVAQUIN) IV  750 mg Intravenous Q24H  . sodium chloride  3 mL Intravenous Q12H   Continuous Infusions:   PRN Meds:.acetaminophen, chlorpheniramine-HYDROcodone, guaiFENesin, LORazepam, promethazine, promethazine  Assessment/Plan: Elizabeth [name] is a 47 year old female with a PMH of AML in remission, she is admitted for sepsis due to community acquired pneumonia.   #Sepsis - Still with tachycardia but improving, 100 during my exam. Nurse recorded a PR of 70 this morning. Review of telemetry overnight shows a few alarms due to sinus tachycardia. Patient is still afebrile. Leukocytosis has resolved. She does not appear toxic on exam. Suspected sources include CAP and UTI. We have ruled out PE on CT angio performed 1/1. - Continue Levaquin 750mg  po to cover CAP and UTI, antibiotic day 4/14 - Discontinue IVF due to clinical improvement - Follow up blood cultures > NGTD - Medically stable for discharge today with home O2 and close [facility] follow up early next week  WBC  Date Value Range Status  09/19/2013 4.8  4.0 - 10.5 K/uL Final  09/18/2013 11.7* 4.0 - 10.5 K/uL Final  09/17/2013 13.0* 4.0 - 10.5 K/uL Final  06/01/2006 4.8  3.9 - 10.0 10e3/uL Final  05/29/2006 3.0* 3.9 - 10.0 10e3/uL Final    #Community acquired pneumonia - Patient presented with productive cough, shortness of breath, fevers. Lungs had crackles and CXR showed bialteral lower lobe infiltrates. Port score 55; CURB-65 0. Flu panel negative. HIV NR. Urine strep pneumo antigen negative. A1C 5.4 (wnl). Group A strep culture with no suspicious colonies. CT and repeat CXR performed on 1/1 showed slightly progressive multi focal pneumonia, but she is  clinically improving so no need to escalate antibiotics at this  time. - Continue Levaquin 750mg  po to cover CAP and UTI, antibiotic day 4/14 - Follow up urine legionella antigen > In process  #Hypoxia - Patient desaturated to 86-87% on my ambulation yesterday. She is 93-94% at rest on RA, 99% on 1L [place]. We ruled out PE yesterday with a CT angio chest. This is likely 2/2 multifocal pneumonia and should resolve with the infection. - Order placed for home O2 continuous at 1L [place]  #Urinary tract infection - UA with many bacteria, few squams, positive for nitrites and leukoesterase. Patient complained of frequency on admission, but not dysuria. She is without back pain or CVA tenderness. - Continue antibiotics as above - Continue to monitor  #Nausea - Could be 2/2 UTI vs.medication side effect from Levaquin. She had a prolonged QTc on admission EKG which has resolved, so we will avoid Zofran. - Phenergan 12.5 mg po q4h prn nausea - Will discharge with a prescription for this  #Hypokalemia - K= 2.5 on admission, repleated with KDur 152mEq. This morning improved to 3.9. Magnesium 1.9. - Daily BMP  Potassium  Date Value Range Status  09/20/2013 3.7  3.7 - 5.3 mEq/L Final     Please note change in reference range.  09/19/2013 3.9  3.7 - 5.3 mEq/L Final     Please note change in reference range.  09/18/2013 4.0  3.7 - 5.3 mEq/L Final     HEMOLYSIS AT THIS LEVEL MAY AFFECT RESULT     Please note change in reference range.     DELTA CHECK NOTED  09/18/2013 3.2* 3.7 - 5.3 mEq/L Final     Please note change in reference range.     DELTA CHECK NOTED  09/17/2013 2.5* 3.7 - 5.3 mEq/L Final     Please note change in reference range.     CRITICAL RESULT CALLED TO, READ BACK BY AND VERIFIED WITH:     D HUGHES,RN [id number] 09/17/13 WBOND    #Prolonged QTc - QTc 532 on admission. Likely 2/2 hypokalemia as resolved to 429 after repletion. - Repeat EKG as outpatient  #Metabolic Alkalosis -Bicarb 36 on  admission. Likely secondary to emesis and poor PO intake. Bicarb 31 (wnl) today. - Continue to monitor  #Chest Tightness - Likely related to CAP/URI. Since patient has a strong family history of MI with questionable ST depression in inferior leads, we cycled cardiac enzymes which were negative x3. Repeat EKG unchanged. Chest tightness has improved today, but we are also ruling out PE due to persistent tachycardia. - Xopenex (given Tachycardia) Q6h PRN   #Conjunctivitis - Affecting bilateral eyes, most likely viral and reportedly predated symptoms.  - Warm compress  #Opacity on CXR - 2cm Opacity noted in LLL.  - Will need f/u CXR as an outpatient after resolution of pneumonia  #Elevated Hemoglobin - Hgb 15.1 on admission thought to represent hemoconcentration from vomiting/poor PO intake. After fluid resuscitation it has dropped to 11.1. - Follow up am CBC  #Diet - Regular   #Code Status - Full   #DVT PPx - Heparin Sq   Dispo: Disposition is deferred at this time, awaiting improvement of current medical problems.  Anticipated discharge in approximately 1-3 day(s).   The patient does have a current PCP (Philis Fendt, MD) and does need an [facility] hospital follow-up appointment after discharge.  The patient does not have transportation limitations that hinder transportation to clinic appointments.  .Services Needed at time of discharge: Y = Yes, Blank = No PT:   OT:  RN:   Equipment:   Other:     LOS: 3 days   Lesly Dukes, MD 09/20/2013, 9:00 AM

## 2013-09-20 NOTE — Progress Notes (Addendum)
PT ambulated 1586ft RA stat 93%. Rest 95% RA.   Pt had some N/v early in shift PRN given. No complaints of N/v at this time or pain. Will continue to monitor

## 2013-09-24 LAB — CULTURE, BLOOD (ROUTINE X 2)
Culture: NO GROWTH
Culture: NO GROWTH

## 2013-10-07 ENCOUNTER — Ambulatory Visit
Admission: RE | Admit: 2013-10-07 | Discharge: 2013-10-07 | Disposition: A | Discharge status: Home / Self Care | Payer: Medicare Other | Source: Ambulatory Visit

## 2013-10-07 DIAGNOSIS — Z1231 Encounter for screening mammogram for malignant neoplasm of breast: Secondary | ICD-10-CM

## 2014-01-02 ENCOUNTER — Emergency Department (HOSPITAL_COMMUNITY)
Admission: EM | Admit: 2014-01-02 | Discharge: 2014-01-02 | Disposition: A | Discharge status: Home / Self Care | Payer: Medicare Other | Source: Home / Self Care | Attending: Family Medicine | Admitting: Family Medicine

## 2014-01-02 ENCOUNTER — Emergency Department (INDEPENDENT_AMBULATORY_CARE_PROVIDER_SITE_OTHER): Payer: Medicare Other

## 2014-01-02 ENCOUNTER — Encounter (HOSPITAL_COMMUNITY): Payer: Self-pay | Admitting: Emergency Medicine

## 2014-01-02 DIAGNOSIS — J40 Bronchitis, not specified as acute or chronic: Secondary | ICD-10-CM

## 2014-01-02 MED ORDER — PREDNISONE 10 MG PO TABS: 50.0000 mg | ORAL_TABLET | Freq: Every day | ORAL | Status: AC

## 2014-01-02 MED ORDER — ALBUTEROL SULFATE (2.5 MG/3ML) 0.083% IN NEBU
INHALATION_SOLUTION | RESPIRATORY_TRACT | Status: AC
  Filled 2014-01-02: qty 3

## 2014-01-02 MED ORDER — ALBUTEROL SULFATE (2.5 MG/3ML) 0.083% IN NEBU
2.5000 mg | INHALATION_SOLUTION | Freq: Once | RESPIRATORY_TRACT | Status: AC
  Administered 2014-01-02: 2.5 mg via RESPIRATORY_TRACT

## 2014-01-02 MED ORDER — ALBUTEROL SULFATE HFA 108 (90 BASE) MCG/ACT IN AERS: 2.0000 | INHALATION_SPRAY | RESPIRATORY_TRACT | Status: AC | PRN

## 2014-01-02 NOTE — ED Provider Notes (Signed)
CSN: [id number]     Arrival date & time 01/02/14  0827 History   First MD Initiated Contact with Patient 01/02/14 1054     Chief Complaint  Patient presents with  . URI   (Consider location/radiation/quality/duration/timing/severity/associated sxs/prior Treatment) Patient is a 48 y.o. female presenting with URI. The history is provided by the patient.  URI Presenting symptoms: congestion, cough and ear pain   Presenting symptoms: no fever, no rhinorrhea and no sore throat   Severity:  Moderate Onset quality:  Gradual Duration:  5 days Timing:  Constant Progression:  Worsening Chronicity:  New Relieved by:  Nothing Worsened by:  Nothing tried Ineffective treatments:  OTC medications Associated symptoms: no myalgias, no sinus pain, no swollen glands and no wheezing     Past Medical History  Diagnosis Date  . AML (acute myeloblastic leukemia) 2007   Past Surgical History  Procedure Laterality Date  . Tonsillectomy  1996  . Cholecystectomy  2005   Family History  Problem Relation Age of Onset  . Diabetes Mother   . Hyperlipidemia Mother   . Diabetes Brother   . Hypertension Brother   . Heart attack Father 76  . Heart attack Sister 29   History  Substance Use Topics  . Smoking status: Never Smoker   . Smokeless tobacco: Not on file  . Alcohol Use: No   OB History   Grav Para Term Preterm Abortions TAB SAB Ect Mult Living                 Review of Systems  Constitutional: Negative for fever and chills.  HENT: Positive for congestion and ear pain. Negative for postnasal drip, rhinorrhea, sinus pressure and sore throat.   Respiratory: Positive for cough. Negative for wheezing.        Lower anterior rib area pain with coughing and deep breathing  Musculoskeletal: Negative for myalgias.    Allergies  Aspirin  Home Medications   Prior to Admission medications   Medication Sig Start Date End Date Taking? Authorizing Provider  guaiFENesin (MUCINEX) 600 MG 12 hr  tablet Take 600 mg by mouth 2 (two) times daily as needed for cough or to loosen phlegm.   Yes Historical Provider, MD  LORazepam (ATIVAN) 0.5 MG tablet Take 0.5 mg by mouth daily as needed for anxiety.    Yes Historical Provider, MD  albuterol (PROAIR HFA) 108 (90 BASE) MCG/ACT inhaler Inhale 2 puffs into the lungs every 4 (four) hours as needed for wheezing or shortness of breath. 01/02/14   Carvel Getting, NP  chlorpheniramine-HYDROcodone (TUSSIONEX) 10-8 MG/5ML LQCR Take 5 mLs by mouth every 12 (twelve) hours as needed for cough. 09/20/13   Lesly Dukes, MD  Dextromethorphan-Guaifenesin ([place]ott FAST-MAX DM MAX) 5-100 MG/5ML LIQD Take 30 mLs by mouth 2 (two) times daily as needed (for cold).    Historical Provider, MD  Homeopathic Products (EARACHE DROPS) SOLN Place 2 drops in ear(s) daily as needed (for ear pain).    Historical Provider, MD  predniSONE (DELTASONE) 10 MG tablet Take 5 tablets (50 mg total) by mouth daily. 01/02/14   Carvel Getting, NP  promethazine (PHENERGAN) 12.5 MG tablet Take 1 tablet (12.5 mg total) by mouth every 4 (four) hours as needed for nausea. 09/20/13   Lesly Dukes, MD   BP 115/81  Pulse 123  Temp(Src) 99.5 F (37.5 C) (Oral)  Resp 18  SpO2 98%  LMP 12/28/2013 Physical Exam  Constitutional: She appears well-developed and well-nourished. She appears ill.  No distress.  HENT:  Right Ear: External ear and ear canal normal. A middle ear effusion is present.  Left Ear: External ear and ear canal normal. A middle ear effusion is present.  Nose: Mucosal edema present. No rhinorrhea. Right sinus exhibits no maxillary sinus tenderness and no frontal sinus tenderness. Left sinus exhibits no maxillary sinus tenderness and no frontal sinus tenderness.  Mouth/Throat: Oropharynx is clear and moist and mucous membranes are normal.  Cardiovascular: Normal rate and regular rhythm.   Pulmonary/Chest: Effort normal and breath sounds normal. She exhibits no tenderness.  coughing   Lymphadenopathy:       Head (right side): No submental, no submandibular and no tonsillar adenopathy present.       Head (left side): No submental, no submandibular and no tonsillar adenopathy present.    ED Course  Procedures (including critical care time) Labs Review Labs Reviewed - No data to display  Results for orders placed during the hospital encounter of 09/17/13  CULTURE, GROUP A STREP      Result Value Ref Range   Specimen Description THROAT     Special Requests NONE     Culture       Value: No Beta Hemolytic Streptococci Isolated     Performed at [facility]   Report Status 09/19/2013 FINAL    CULTURE, BLOOD (ROUTINE X 2)      Result Value Ref Range   Specimen Description BLOOD RIGHT ARM     Special Requests BOTTLES DRAWN AEROBIC ONLY [place]     Culture  Setup Time       Value: 09/18/2013 09:01     Performed at [facility]   Culture       Value: NO GROWTH 5 DAYS     Performed at [facility]   Report Status 09/24/2013 FINAL    CULTURE, BLOOD (ROUTINE X 2)      Result Value Ref Range   Specimen Description BLOOD RIGHT HAND     Special Requests BOTTLES DRAWN AEROBIC ONLY 5CC     Culture  Setup Time       Value: 09/18/2013 09:01     Performed at [facility]   Culture       Value: NO GROWTH 5 DAYS     Performed at [facility]   Report Status 09/24/2013 FINAL    CBC WITH DIFFERENTIAL      Result Value Ref Range   WBC 13.0 (*) 4.0 - 10.5 K/uL   RBC 4.85  3.87 - 5.11 MIL/uL   Hemoglobin 15.1 (*) 12.0 - 15.0 g/dL   HCT 42.1  36.0 - 46.0 %   MCV 86.8  78.0 - 100.0 fL   MCH 31.1  26.0 - 34.0 pg   MCHC 35.9  30.0 - 36.0 g/dL   RDW 12.4  11.5 - 15.5 %   Platelets 240  150 - 400 K/uL   Neutrophils Relative % 86 (*) 43 - 77 %   Neutro Abs 11.2 (*) 1.7 - 7.7 K/uL   Lymphocytes Relative 8 (*) 12 - 46 %   Lymphs Abs 1.1  0.7 - 4.0 K/uL   Monocytes Relative 6  3 - 12 %   Monocytes Absolute 0.7  0.1 - 1.0 K/uL   Eosinophils  Relative 0  0 - 5 %   Eosinophils Absolute 0.0  0.0 - 0.7 K/uL   Basophils Relative 0  0 - 1 %   Basophils Absolute 0.0  0.0 - 0.1 K/uL  BASIC METABOLIC PANEL      Result Value Ref Range   Sodium 135 (*) 137 - 147 mEq/L   Potassium 2.5 (*) 3.7 - 5.3 mEq/L   Chloride 84 (*) 96 - 112 mEq/L   CO2 36 (*) 19 - 32 mEq/L   Glucose, Bld 130 (*) 70 - 99 mg/dL   BUN 10  6 - 23 mg/dL   Creatinine, Ser 0.73  0.50 - 1.10 mg/dL   Calcium 9.6  8.4 - 10.5 mg/dL   GFR calc non Af Amer >90  >90 mL/min   GFR calc Af Amer >90  >90 mL/min  URINALYSIS, ROUTINE W REFLEX MICROSCOPIC      Result Value Ref Range   Color, Urine AMBER (*) YELLOW   APPearance CLEAR  CLEAR   Specific Gravity, Urine 1.025  1.005 - 1.030   pH 6.0  5.0 - 8.0   Glucose, UA NEGATIVE  NEGATIVE mg/dL   Hgb urine dipstick NEGATIVE  NEGATIVE   Bilirubin Urine MODERATE (*) NEGATIVE   Ketones, ur 15 (*) NEGATIVE mg/dL   Protein, ur 100 (*) NEGATIVE mg/dL   Urobilinogen, UA 4.0 (*) 0.0 - 1.0 mg/dL   Nitrite POSITIVE (*) NEGATIVE   Leukocytes, UA SMALL (*) NEGATIVE  URINE MICROSCOPIC-ADD ON      Result Value Ref Range   Squamous Epithelial / LPF RARE  RARE   WBC, UA 0-2  <3 WBC/hpf   RBC / HPF 0-2  <3 RBC/hpf   Bacteria, UA MANY (*) RARE   Urine-Other MUCOUS PRESENT    INFLUENZA PANEL BY PCR      Result Value Ref Range   Influenza A By PCR NEGATIVE  NEGATIVE   Influenza B By PCR NEGATIVE  NEGATIVE   H1N1 flu by pcr NOT DETECTED  NOT DETECTED  CBC      Result Value Ref Range   WBC 11.7 (*) 4.0 - 10.5 K/uL   RBC 4.33  3.87 - 5.11 MIL/uL   Hemoglobin 12.9  12.0 - 15.0 g/dL   HCT 37.8  36.0 - 46.0 %   MCV 87.3  78.0 - 100.0 fL   MCH 29.8  26.0 - 34.0 pg   MCHC 34.1  30.0 - 36.0 g/dL   RDW 12.5  11.5 - 15.5 %   Platelets 271  150 - 400 K/uL  TROPONIN I      Result Value Ref Range   Troponin I <0.30  <0.30 ng/mL  TROPONIN I      Result Value Ref Range   Troponin I <0.30  <0.30 ng/mL  TROPONIN I      Result Value Ref Range    Troponin I <0.30  <0.30 ng/mL  LEGIONELLA ANTIGEN, URINE      Result Value Ref Range   Specimen Description URINE, CLEAN CATCH     Special Requests NONE     Legionella Antigen, Urine       Value: Negative for Legionella pneumophilia serogroup 1     Performed at [facility]   Report Status 09/20/2013 FINAL    STREP PNEUMONIAE URINARY ANTIGEN      Result Value Ref Range   Strep Pneumo Urinary Antigen NEGATIVE  NEGATIVE  HEMOGLOBIN A1C      Result Value Ref Range   Hemoglobin A1C 5.4  <5.7 %   Mean Plasma Glucose 108  <117 mg/dL  COMPREHENSIVE METABOLIC PANEL      Result Value Ref Range  Sodium 138  137 - 147 mEq/L   Potassium 3.2 (*) 3.7 - 5.3 mEq/L   Chloride 91 (*) 96 - 112 mEq/L   CO2 33 (*) 19 - 32 mEq/L   Glucose, Bld 135 (*) 70 - 99 mg/dL   BUN 9  6 - 23 mg/dL   Creatinine, Ser 0.61  0.50 - 1.10 mg/dL   Calcium 9.2  8.4 - 10.5 mg/dL   Total Protein 6.9  6.0 - 8.3 g/dL   Albumin 2.7 (*) 3.5 - 5.2 g/dL   AST 21  0 - 37 U/L   ALT 18  0 - 35 U/L   Alkaline Phosphatase 116  39 - 117 U/L   Total Bilirubin 0.8  0.3 - 1.2 mg/dL   GFR calc non Af Amer >90  >90 mL/min   GFR calc Af Amer >90  >90 mL/min  HIV ANTIBODY (ROUTINE TESTING)      Result Value Ref Range   HIV NON REACTIVE  NON REACTIVE  BASIC METABOLIC PANEL      Result Value Ref Range   Sodium 137  137 - 147 mEq/L   Potassium 4.0  3.7 - 5.3 mEq/L   Chloride 94 (*) 96 - 112 mEq/L   CO2 32  19 - 32 mEq/L   Glucose, Bld 136 (*) 70 - 99 mg/dL   BUN 8  6 - 23 mg/dL   Creatinine, Ser 0.57  0.50 - 1.10 mg/dL   Calcium 8.7  8.4 - 10.5 mg/dL   GFR calc non Af Amer >90  >90 mL/min   GFR calc Af Amer >90  >90 mL/min  MAGNESIUM      Result Value Ref Range   Magnesium 1.9  1.5 - 2.5 mg/dL  BASIC METABOLIC PANEL      Result Value Ref Range   Sodium 139  137 - 147 mEq/L   Potassium 3.9  3.7 - 5.3 mEq/L   Chloride 98  96 - 112 mEq/L   CO2 31  19 - 32 mEq/L   Glucose, Bld 94  70 - 99 mg/dL   BUN 6  6 - 23  mg/dL   Creatinine, Ser 0.55  0.50 - 1.10 mg/dL   Calcium 8.9  8.4 - 10.5 mg/dL   GFR calc non Af Amer >90  >90 mL/min   GFR calc Af Amer >90  >90 mL/min  CBC      Result Value Ref Range   WBC 4.8  4.0 - 10.5 K/uL   RBC 3.74 (*) 3.87 - 5.11 MIL/uL   Hemoglobin 11.1 (*) 12.0 - 15.0 g/dL   HCT 33.4 (*) 36.0 - 46.0 %   MCV 89.3  78.0 - 100.0 fL   MCH 29.7  26.0 - 34.0 pg   MCHC 33.2  30.0 - 36.0 g/dL   RDW 12.7  11.5 - 15.5 %   Platelets 200  150 - 400 K/uL  TSH      Result Value Ref Range   TSH 0.034 (*) 0.350 - 4.500 uIU/mL  T4, FREE      Result Value Ref Range   Free T4 1.32  0.80 - 1.80 ng/dL  T3, FREE      Result Value Ref Range   T3, Free 2.7  2.3 - 4.2 pg/mL  T3      Result Value Ref Range   T3, Total 98.0  80.0 - 204.0 ng/dl  BASIC METABOLIC PANEL      Result Value Ref Range  Sodium 138  137 - 147 mEq/L   Potassium 3.7  3.7 - 5.3 mEq/L   Chloride 97  96 - 112 mEq/L   CO2 30  19 - 32 mEq/L   Glucose, Bld 98  70 - 99 mg/dL   BUN 7  6 - 23 mg/dL   Creatinine, Ser 0.56  0.50 - 1.10 mg/dL   Calcium 8.9  8.4 - 10.5 mg/dL   GFR calc non Af Amer >90  >90 mL/min   GFR calc Af Amer >90  >90 mL/min  PRO B NATRIURETIC PEPTIDE      Result Value Ref Range   Pro B Natriuretic peptide (BNP) 48.2  0 - 125 pg/mL   Imaging Review Dg Chest 2 View  01/02/2014   CLINICAL DATA:  Cough.  Fever.  EXAM: CHEST  2 VIEW  COMPARISON:  01/01/ 2015  FINDINGS: There are prominent bronchovascular markings lung bases with evidence of bronchial wall thickening, less prominent than on the prior study. No focal consolidation. No evidence of pulmonary edema. No pleural effusion or pneumothorax.  Heart, mediastinum and hila are normal.  Both thorax is intact.  IMPRESSION: 1. Lower lobe bronchial wall thickening which may be chronic. Given the symptoms however, acute bronchitis should be considered. 2. No evidence of pneumonia.  No other abnormality.   Electronically Signed   By: Lajean Manes M.D.   On:  01/02/2014 09:50     MDM   1. Bronchitis   pt given albuterol 2.5mg  nebs here at [facility] and felt much better. Rx albuterol HFA 2 puffs every 4 hours prn sob, #1. Given spacer. Rx prednisone 50mg  daily for 5 days. Pt to use saline nasal spray as needed.      Carvel Getting, NP 01/02/14 [place] Komal Stangelo, NP 01/02/14 1124

## 2014-01-02 NOTE — Discharge Instructions (Signed)
Try using saline nasal spray to help your congestion.  You can use it several times a day.    Bronchitis Bronchitis is swelling (inflammation) of the air tubes leading to your lungs (bronchi). This causes mucus and a cough. If the swelling gets bad, you may have trouble breathing. HOME CARE   Rest.  Drink enough fluids to keep your pee (urine) clear or pale yellow (unless you have a condition where you have to watch how much you drink).  Only take medicine as told by your doctor. If you were given antibiotic medicines, finish them even if you start to feel better.  Avoid smoke, irritating chemicals, and strong smells. These make the problem worse. Quit smoking if you smoke. This helps your lungs heal faster.  Use a cool mist humidifier. Change the water in the humidifier every day. You can also sit in the bathroom with hot shower running for 5 10 minutes. Keep the door closed.  See your health care provider as told.  Wash your hands often. GET HELP IF: Your problems do not get better after 1 week. GET HELP RIGHT AWAY IF:   Your fever gets worse.  You have chills.  Your chest hurts.  Your problems breathing get worse.  You have blood in your mucus.  You pass out (faint).  You feel lightheaded.  You have a bad headache.  You throw up (vomit) again and again. MAKE SURE YOU:  Understand these instructions.  Will watch your condition.  Will get help right away if you are not doing well or get worse. Document Released: 02/22/2008 Document Revised: 06/26/2013 Document Reviewed: 04/30/2013 [facility] Patient Information 2014 [place], [place].

## 2014-01-02 NOTE — ED Notes (Signed)
C/o  Productive cough with green/yellow sputum.  Fever.  Bilateral ear pan.  Vomiting off/on since Saturday.   Mild relief of cough with otc meds.   Denies diarrhea

## 2014-01-03 NOTE — ED Provider Notes (Signed)
Medical screening examination/treatment/procedure(s) were performed by a resident physician or non-physician practitioner and as the supervising physician I was immediately available for consultation/collaboration.  Lynne Leader, MD    Gregor Hams, MD 01/03/14 [phone]

## 2014-11-27 DIAGNOSIS — F419 Anxiety disorder, unspecified: Secondary | ICD-10-CM | POA: Diagnosis not present

## 2014-11-27 DIAGNOSIS — J302 Other seasonal allergic rhinitis: Secondary | ICD-10-CM | POA: Diagnosis not present

## 2014-11-27 DIAGNOSIS — K219 Gastro-esophageal reflux disease without esophagitis: Secondary | ICD-10-CM | POA: Diagnosis not present

## 2014-11-27 DIAGNOSIS — F79 Unspecified intellectual disabilities: Secondary | ICD-10-CM | POA: Diagnosis not present

## 2014-12-04 DIAGNOSIS — C9201 Acute myeloblastic leukemia, in remission: Secondary | ICD-10-CM | POA: Diagnosis not present

## 2014-12-11 ENCOUNTER — Other Ambulatory Visit: Payer: Self-pay

## 2014-12-11 DIAGNOSIS — Z1231 Encounter for screening mammogram for malignant neoplasm of breast: Secondary | ICD-10-CM

## 2014-12-17 ENCOUNTER — Ambulatory Visit
Admission: RE | Admit: 2014-12-17 | Discharge: 2014-12-17 | Disposition: A | Discharge status: Home / Self Care | Payer: Medicare Other | Source: Ambulatory Visit

## 2014-12-17 ENCOUNTER — Encounter (INDEPENDENT_AMBULATORY_CARE_PROVIDER_SITE_OTHER): Payer: Self-pay

## 2014-12-17 DIAGNOSIS — Z1231 Encounter for screening mammogram for malignant neoplasm of breast: Secondary | ICD-10-CM

## 2015-06-11 DIAGNOSIS — Z856 Personal history of leukemia: Secondary | ICD-10-CM | POA: Diagnosis not present

## 2015-06-11 DIAGNOSIS — Z08 Encounter for follow-up examination after completed treatment for malignant neoplasm: Secondary | ICD-10-CM | POA: Diagnosis not present

## 2015-06-11 DIAGNOSIS — Z886 Allergy status to analgesic agent status: Secondary | ICD-10-CM | POA: Diagnosis not present

## 2015-06-11 DIAGNOSIS — C9201 Acute myeloblastic leukemia, in remission: Secondary | ICD-10-CM | POA: Diagnosis not present

## 2015-06-11 DIAGNOSIS — Z9221 Personal history of antineoplastic chemotherapy: Secondary | ICD-10-CM | POA: Diagnosis not present

## 2015-06-11 DIAGNOSIS — F419 Anxiety disorder, unspecified: Secondary | ICD-10-CM | POA: Diagnosis not present

## 2015-06-11 DIAGNOSIS — Z79899 Other long term (current) drug therapy: Secondary | ICD-10-CM | POA: Diagnosis not present

## 2015-06-11 DIAGNOSIS — Z0000 Encounter for general adult medical examination without abnormal findings: Secondary | ICD-10-CM | POA: Diagnosis not present

## 2015-06-11 DIAGNOSIS — M545 Low back pain: Secondary | ICD-10-CM | POA: Diagnosis not present

## 2015-07-09 DIAGNOSIS — Z131 Encounter for screening for diabetes mellitus: Secondary | ICD-10-CM | POA: Diagnosis not present

## 2015-07-09 DIAGNOSIS — Z23 Encounter for immunization: Secondary | ICD-10-CM | POA: Diagnosis not present

## 2015-07-09 DIAGNOSIS — J302 Other seasonal allergic rhinitis: Secondary | ICD-10-CM | POA: Diagnosis not present

## 2015-07-09 DIAGNOSIS — F419 Anxiety disorder, unspecified: Secondary | ICD-10-CM | POA: Diagnosis not present

## 2015-07-09 DIAGNOSIS — Z13220 Encounter for screening for lipoid disorders: Secondary | ICD-10-CM | POA: Diagnosis not present

## 2015-07-21 DIAGNOSIS — M545 Low back pain: Secondary | ICD-10-CM | POA: Diagnosis not present

## 2015-07-21 DIAGNOSIS — C9201 Acute myeloblastic leukemia, in remission: Secondary | ICD-10-CM | POA: Diagnosis not present

## 2015-08-20 DIAGNOSIS — K219 Gastro-esophageal reflux disease without esophagitis: Secondary | ICD-10-CM | POA: Diagnosis not present

## 2015-08-20 DIAGNOSIS — F79 Unspecified intellectual disabilities: Secondary | ICD-10-CM | POA: Diagnosis not present

## 2015-08-20 DIAGNOSIS — F419 Anxiety disorder, unspecified: Secondary | ICD-10-CM | POA: Diagnosis not present

## 2015-08-20 DIAGNOSIS — Z6836 Body mass index (BMI) 36.0-36.9, adult: Secondary | ICD-10-CM | POA: Diagnosis not present

## 2015-10-22 ENCOUNTER — Other Ambulatory Visit: Payer: Self-pay

## 2015-10-22 DIAGNOSIS — Z1231 Encounter for screening mammogram for malignant neoplasm of breast: Secondary | ICD-10-CM

## 2015-12-18 ENCOUNTER — Ambulatory Visit
Admission: RE | Admit: 2015-12-18 | Discharge: 2015-12-18 | Disposition: A | Discharge status: Home / Self Care | Payer: Medicare Other | Source: Ambulatory Visit

## 2015-12-18 DIAGNOSIS — Z1231 Encounter for screening mammogram for malignant neoplasm of breast: Secondary | ICD-10-CM

## 2016-08-21 ENCOUNTER — Encounter (HOSPITAL_COMMUNITY): Payer: Self-pay | Admitting: *Deleted

## 2016-08-21 ENCOUNTER — Emergency Department (HOSPITAL_COMMUNITY)
Admission: EM | Admit: 2016-08-21 | Discharge: 2016-09-19 | Disposition: E | Payer: Medicare Other | Attending: Emergency Medicine | Admitting: Emergency Medicine

## 2016-08-21 ENCOUNTER — Emergency Department (HOSPITAL_COMMUNITY): Payer: Medicare Other

## 2016-08-21 DIAGNOSIS — R6521 Severe sepsis with septic shock: Secondary | ICD-10-CM | POA: Diagnosis not present

## 2016-08-21 DIAGNOSIS — R4182 Altered mental status, unspecified: Secondary | ICD-10-CM | POA: Diagnosis present

## 2016-08-21 DIAGNOSIS — A419 Sepsis, unspecified organism: Secondary | ICD-10-CM | POA: Insufficient documentation

## 2016-08-21 LAB — I-STAT ARTERIAL BLOOD GAS, ED
Acid-base deficit: 11 mmol/L — ABNORMAL HIGH (ref 0.0–2.0)
BICARBONATE: 15.1 mmol/L — AB (ref 20.0–28.0)
O2 Saturation: 99 %
Patient temperature: 104.3
TCO2: 16 mmol/L (ref 0–100)
pCO2 arterial: 38.5 mmHg (ref 32.0–48.0)
pH, Arterial: 7.218 — ABNORMAL LOW (ref 7.350–7.450)
pO2, Arterial: 201 mmHg — ABNORMAL HIGH (ref 83.0–108.0)

## 2016-08-21 LAB — I-STAT CHEM 8, ED
BUN: 8 mg/dL (ref 6–20)
CALCIUM ION: 1.1 mmol/L — AB (ref 1.15–1.40)
CHLORIDE: 97 mmol/L — AB (ref 101–111)
Creatinine, Ser: 1.1 mg/dL — ABNORMAL HIGH (ref 0.44–1.00)
Glucose, Bld: 79 mg/dL (ref 65–99)
HCT: 21 % — ABNORMAL LOW (ref 36.0–46.0)
HEMOGLOBIN: 7.1 g/dL — AB (ref 12.0–15.0)
Potassium: 4.1 mmol/L (ref 3.5–5.1)
SODIUM: 127 mmol/L — AB (ref 135–145)
TCO2: 16 mmol/L (ref 0–100)

## 2016-08-21 LAB — I-STAT CG4 LACTIC ACID, ED: Lactic Acid, Venous: 8.98 mmol/L (ref 0.5–1.9)

## 2016-08-21 LAB — CBG MONITORING, ED: GLUCOSE-CAPILLARY: 68 mg/dL (ref 65–99)

## 2016-08-21 MED ORDER — DEXTROSE 50 % IV SOLN
INTRAVENOUS | Status: AC
  Administered 2016-08-21: 50 mL
  Filled 2016-08-21: qty 50

## 2016-08-21 MED ORDER — VANCOMYCIN HCL IN DEXTROSE 1-5 GM/200ML-% IV SOLN
1000.0000 mg | Freq: Once | INTRAVENOUS | Status: AC
  Administered 2016-08-21: 1000 mg via INTRAVENOUS
  Filled 2016-08-21: qty 200

## 2016-08-21 MED ORDER — SODIUM CHLORIDE 0.9 % IV BOLUS (SEPSIS): 1000.0000 mL | Freq: Once | INTRAVENOUS | Status: DC

## 2016-08-21 MED ORDER — SODIUM CHLORIDE 0.9 % IV BOLUS (SEPSIS)
1000.0000 mL | Freq: Once | INTRAVENOUS | Status: AC
Start: 2016-08-22 — End: 2016-08-22
  Administered 2016-08-22: 1000 mL via INTRAVENOUS

## 2016-08-21 MED ORDER — ACETAMINOPHEN 650 MG RE SUPP
975.0000 mg | Freq: Once | RECTAL | Status: AC
  Administered 2016-08-21: 975 mg via RECTAL
  Filled 2016-08-21: qty 1

## 2016-08-21 MED ORDER — SODIUM CHLORIDE 0.9 % IV BOLUS (SEPSIS)
1000.0000 mL | Freq: Once | INTRAVENOUS | Status: AC
  Administered 2016-08-21: 1000 mL via INTRAVENOUS

## 2016-08-21 MED ORDER — LORAZEPAM 2 MG/ML IJ SOLN
INTRAMUSCULAR | Status: AC
Start: 2016-08-21 — End: 2016-08-21
  Administered 2016-08-21: 1 mg
  Filled 2016-08-21: qty 1

## 2016-08-21 MED ORDER — LORAZEPAM 2 MG/ML IJ SOLN
INTRAMUSCULAR | Status: AC
  Filled 2016-08-21: qty 1

## 2016-08-21 MED ORDER — PIPERACILLIN-TAZOBACTAM 3.375 G IVPB 30 MIN
3.3750 g | Freq: Once | INTRAVENOUS | Status: AC
  Administered 2016-08-21: 3.375 g via INTRAVENOUS
  Filled 2016-08-21: qty 50

## 2016-08-21 MED ORDER — DEXTROSE 50 % IV SOLN: 50.0000 mL | Freq: Once | INTRAVENOUS | Status: DC

## 2016-08-21 MED ORDER — CEFAZOLIN IN D5W 1 GM/50ML IV SOLN
1.0000 g | Freq: Once | INTRAVENOUS | Status: AC
  Administered 2016-08-22: 1 g via INTRAVENOUS
  Filled 2016-08-21: qty 50

## 2016-08-21 NOTE — ED Triage Notes (Signed)
Brought by [facility] from home for c/o vomiting blood 1 hour pta of ems then going unresponsive.  On arrival to ED noted to be agitated and restless.  Family at the bedside reporting that patient is a DNR and does not want extreme measures.

## 2016-08-21 NOTE — ED Provider Notes (Addendum)
[facility] DEPT Provider Note   CSN: [id number] Arrival date & time: 09/01/2016  2324  By signing my name below, I, Elizabeth [name]. Royston Sinner, attest that this documentation has been prepared under the direction and in the presence of Merryl Hacker, MD.  Electronically Signed: Maud [name]. Royston Sinner, ED Scribe. 08/20/2016. 11:34 PM.    History   Chief Complaint Chief Complaint  Patient presents with  . Fever   The history is provided by the EMS personnel and a relative. No language interpreter was used.    LEVEL 5 CAVEAT- ALTERED MENTAL STATUS   HPI Comments: IYARI CAVICCHI is a 50 y.o. 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 Per EMS, pt told family "i need to go to the hospital". Pt then had several episodes of hematemesis prior to EMS arrival. Pink frothy sputum noted prior to departure to hospital. Per EMS, pt sat down before leaving the home and suddenly went unresponsive. Pt then began breathing rapidly. NRB applied en route to department. Per family, pt is a DNR/DNI.  Mother states pt is currently on Ancef for an "infection in her blood".  Per chart review, staph and E. coli septicemia diagnosed at [facility] on October of this year. 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  PCP: Philis Fendt, MD    Past Medical History:  Diagnosis Date  . AML (acute myeloblastic leukemia) ([place]) 2007    Patient Active Problem List   Diagnosis Date Noted  . UTI (urinary tract infection) 09/18/2013  . Sepsis due to undetermined organism without resultant organ failure ([place]) 09/18/2013  . CAP (community acquired pneumonia) 09/17/2013  . Community acquired pneumonia 09/17/2013    Past Surgical History:  Procedure Laterality Date  . CHOLECYSTECTOMY  2005  . TONSILLECTOMY  1996    OB History    No data available       Home  Medications    Prior to Admission medications   Medication Sig Start Date End Date Taking? Authorizing Provider  albuterol (PROAIR HFA) 108 (90 BASE) MCG/ACT inhaler Inhale 2 puffs into the lungs every 4 (four) hours as needed for wheezing or shortness of breath. 01/02/14   Carvel Getting, NP  chlorpheniramine-HYDROcodone (TUSSIONEX) 10-8 MG/5ML LQCR Take 5 mLs by mouth every 12 (twelve) hours as needed for cough. 09/20/13   Pollie Friar, MD  Dextromethorphan-Guaifenesin ([place] FAST-MAX DM MAX) 5-100 MG/5ML LIQD Take 30 mLs by mouth 2 (two) times daily as needed (for cold).    Historical Provider, MD  guaiFENesin (MUCINEX) 600 MG 12 hr tablet Take 600 mg by mouth 2 (two) times daily as needed for cough or to loosen phlegm.    Historical Provider, MD  Homeopathic Products (EARACHE DROPS) SOLN Place 2 drops in ear(s) daily as needed (for ear pain).    Historical Provider, MD  LORazepam (ATIVAN) 0.5 MG tablet Take 0.5 mg by mouth daily as needed for anxiety.     Historical Provider, MD  predniSONE (DELTASONE) 10 MG tablet Take 5 tablets (50 mg total) by mouth daily. 01/02/14   Carvel Getting, NP  promethazine (PHENERGAN) 12.5 MG tablet Take 1 tablet (12.5 mg total) by mouth every 4 (four) hours as needed for nausea. 09/20/13   Pollie Friar, MD    Family History Family History  Problem Relation Age of Onset  .  Heart attack Father 49  . Diabetes Mother   . Hyperlipidemia Mother   . Diabetes Brother   . Hypertension Brother   . Heart attack Sister 26    Social History Social History  Substance Use Topics  . Smoking status: Never Smoker  . Smokeless tobacco: Not on file  . Alcohol use No     Allergies   Aspirin   Review of Systems Review of Systems  Unable to perform ROS: Mental status change     Physical Exam Updated Vital Signs BP (!) 87/61   Pulse (!) 165   Temp (!) 104.3 F (40.2 C) (Rectal)   Resp (!) 31   Ht 5\' 4"  (1.626 m)   Wt 184 lb (83.5 kg)   LMP  (LMP Unknown)    SpO2 100%   BMI 31.58 kg/m   Physical Exam  Constitutional:  Ill-appearing, agitated delirium, not following commands, moaning  HENT:  Head: Normocephalic and atraumatic.  Eyes: Pupils are equal, round, and reactive to light.  Pupils 4 mm reactive bilaterally  Cardiovascular: Regular rhythm and normal heart sounds.   Tachycardia  Pulmonary/Chest: Breath sounds normal. No respiratory distress.  Tachypnea, clear breath sounds bilaterally with short inspiratory breaths  Abdominal: Soft. Bowel sounds are normal. There is no tenderness. There is no guarding.  Mild bruising over the abdomen  Neurological:  Agitated, not following commands, at times is alert, moves all 4 extremities  Skin:  Hot, clammy  Nursing note and vitals reviewed.    ED Treatments / Results   DIAGNOSTIC STUDIES: Oxygen Saturation is 98% on NRB, Normal by my interpretation.    COORDINATION OF CARE: 11:32 PM- Will give fluids, Zosyn, Tylenol, Vancocin, and Ativan. Will order blood work, EKG, blood cultures, and urinalysis. Discussed treatment plan with pt at bedside and pt agreed to plan.     Labs (all labs ordered are listed, but only abnormal results are displayed) Labs Reviewed  CBC WITH DIFFERENTIAL/PLATELET - Abnormal; Notable for the following:       Result Value   RBC 2.63 (*)    Hemoglobin 7.3 (*)    HCT 22.5 (*)    All other components within normal limits  I-STAT CG4 LACTIC ACID, ED - Abnormal; Notable for the following:    Lactic Acid, Venous 8.98 (*)    All other components within normal limits  I-STAT ARTERIAL BLOOD GAS, ED - Abnormal; Notable for the following:    pH, Arterial 7.218 (*)    pO2, Arterial 201.0 (*)    Bicarbonate 15.1 (*)    Acid-base deficit 11.0 (*)    All other components within normal limits  I-STAT CHEM 8, ED - Abnormal; Notable for the following:    Sodium 127 (*)    Chloride 97 (*)    Creatinine, Ser 1.10 (*)    Calcium, Ion 1.10 (*)    Hemoglobin 7.1 (*)     HCT 21.0 (*)    All other components within normal limits  CBG MONITORING, ED - Abnormal; Notable for the following:    Glucose-Capillary 169 (*)    All other components within normal limits  CULTURE, BLOOD (ROUTINE X 2)  CULTURE, BLOOD (ROUTINE X 2)  URINE CULTURE  COMPREHENSIVE METABOLIC PANEL  URINALYSIS, ROUTINE W REFLEX MICROSCOPIC (NOT AT [facility])  CBG MONITORING, ED  TYPE AND SCREEN    EKG  EKG Interpretation  Date/Time:  Sunday August 21 2016 23:38:14 EST Ventricular Rate:  169 PR Interval:    QRS  Duration: 80 QT Interval:  242 QTC Calculation: 406 R Axis:   67 Text Interpretation:  Sinus tachycardia Ventricular premature complex Confirmed by Dina Rich  MD, Kacee Koren ([id number]) on 09-17-2016 12:20:14 AM       Radiology Dg Chest Port 1 View  Result Date: 17-Sep-2016 CLINICAL DATA:  Acute onset of hematemesis. Patient became unresponsive. Initial encounter. EXAM: PORTABLE CHEST 1 VIEW COMPARISON:  Chest radiograph performed 01/02/2014 FINDINGS: The lungs are hypoexpanded. Vascular congestion is noted. Patchy bilateral airspace opacities may reflect interstitial edema or pneumonia. There is no evidence of pleural effusion or pneumothorax. The cardiomediastinal silhouette is mildly enlarged. No acute osseous abnormalities are seen. A left PICC is noted ending about the distal SVC. IMPRESSION: Lungs hypoexpanded. Vascular congestion and mild cardiomegaly. Patchy bilateral airspace opacities may reflect interstitial edema or pneumonia. Electronically Signed   By: Garald Balding M.D.   On: 2016/09/17 00:09    Procedures Procedures (including critical care time)  CRITICAL CARE Performed by: Merryl Hacker   Total critical care time: 60 minutes  Critical care time was exclusive of separately billable procedures and treating other patients.  Critical care was necessary to treat or prevent imminent or life-threatening deterioration.  Critical care was time spent personally by  me on the following activities: development of treatment plan with patient and/or surrogate as well as nursing, discussions with consultants, evaluation of patient's response to treatment, examination of patient, obtaining history from patient or surrogate, ordering and performing treatments and interventions, ordering and review of laboratory studies, ordering and review of radiographic studies, pulse oximetry and re-evaluation of patient's condition.   Medications Ordered in ED Medications  sodium chloride 0.9 % bolus 1,000 mL (1,000 mLs Intravenous New Bag/Given 08/20/2016 2315)    And  sodium chloride 0.9 % bolus 1,000 mL (not administered)    And  sodium chloride 0.9 % bolus 1,000 mL (not administered)  LORazepam (ATIVAN) 2 MG/ML injection (not administered)  vancomycin (VANCOCIN) IVPB 1000 mg/200 mL premix (1,000 mg Intravenous New Bag/Given 08/23/2016 2342)  ceFAZolin (ANCEF) IVPB 1 g/50 mL premix (not administered)  dextrose 50 % solution 50 mL (not administered)  ziprasidone (GEODON) injection 10 mg (not administered)  LORazepam (ATIVAN) injection 1 mg (not administered)  sterile water (preservative free) injection (not administered)  LORazepam (ATIVAN) 2 MG/ML injection (1 mg  Given 08/20/2016 2332)  piperacillin-tazobactam (ZOSYN) IVPB 3.375 g (3.375 g Intravenous New Bag/Given 09/15/2016 2342)  acetaminophen (TYLENOL) suppository 975 mg (975 mg Rectal Given 09/16/2016 2341)  dextrose 50 % solution (50 mLs  Given 08/30/2016 2357)     Initial Impression / Assessment and Plan / ED Course  I have reviewed the triage vital signs and the nursing notes.  Pertinent labs & imaging results that were available during my care of the patient were reviewed by me and considered in my medical decision making (see chart for details).  Clinical Course     Patient presents with agitated delirium and altered mental status.  Currently AML patient.  Not getting any active treatment. Mother reports that she is no  longer getting chemotherapy. She is getting antibiotics for bacteremia. She is agitated on exam. Tachycardic, hypotensive, febrile to 104.3. Patient given Tylenol. 30 mL/kg ordered. Sepsis protocol initiated. She was given vancomycin, Zosyn, and Ancef. She is DO NOT RESUSCITATE and DO NOT INTUBATE per her mother. I have also reviewed her chart at [facility] and this reflects her wishes to be DO NOT RESUSCITATE/DO NOT INTUBATE. Patient received 2 mg of  Ativan for delirium. She remained agitated. I discussed the patient with Dr. Lake Bells. Will attempt an antipsychotic while trying to stabilize the patient and further discuss goals of care. Patient given additional 1 mg of Ativan and 10 mg of Geodon.  12:29 AM Patient persistently hypotensive.  Levophed ordered.  12:55 AM Was asked to evaluate the patient by nursing and the ICU fellow. Patient ceased breathing and had persistent bradycardia. On my evaluation, no femoral or carotid pulse.  She is PEA on the monitor. Time of death 10.  Her immediate family is at the bedside as well as the chaplain.  Final Clinical Impressions(s) / ED Diagnoses   Final diagnoses:  Septic shock ([place])  Altered mental status, unspecified altered mental status type    New Prescriptions New Prescriptions   No medications on file   I personally performed the services described in this documentation, which was scribed in my presence. The recorded information has been reviewed and is accurate.    Merryl Hacker, MD August 28, 2016 [id number]    Merryl Hacker, MD Aug 28, 2016 [id number]    Merryl Hacker, MD 28-Aug-2016 [id number]    Merryl Hacker, MD 28-Aug-2016 [phone]

## 2016-08-22 LAB — COMPREHENSIVE METABOLIC PANEL
ALBUMIN: 2.1 g/dL — AB (ref 3.5–5.0)
ALT: 18 U/L (ref 14–54)
AST: 102 U/L — ABNORMAL HIGH (ref 15–41)
Alkaline Phosphatase: 472 U/L — ABNORMAL HIGH (ref 38–126)
Anion gap: 17 — ABNORMAL HIGH (ref 5–15)
BUN: 9 mg/dL (ref 6–20)
CO2: 14 mmol/L — AB (ref 22–32)
Calcium: 7.6 mg/dL — ABNORMAL LOW (ref 8.9–10.3)
Chloride: 96 mmol/L — ABNORMAL LOW (ref 101–111)
Creatinine, Ser: 1.44 mg/dL — ABNORMAL HIGH (ref 0.44–1.00)
GFR calc Af Amer: 49 mL/min — ABNORMAL LOW (ref >60)
GFR calc non Af Amer: 42 mL/min — ABNORMAL LOW (ref >60)
GLUCOSE: 84 mg/dL (ref 65–99)
POTASSIUM: 4.1 mmol/L (ref 3.5–5.1)
SODIUM: 127 mmol/L — AB (ref 135–145)
Total Bilirubin: 1.5 mg/dL — ABNORMAL HIGH (ref 0.3–1.2)
Total Protein: 5.3 g/dL — ABNORMAL LOW (ref 6.5–8.1)

## 2016-08-22 LAB — BLOOD CULTURE ID PANEL (REFLEXED)
ACINETOBACTER BAUMANNII: NOT DETECTED
CANDIDA ALBICANS: NOT DETECTED
CANDIDA GLABRATA: NOT DETECTED
CANDIDA KRUSEI: NOT DETECTED
CANDIDA PARAPSILOSIS: NOT DETECTED
Candida tropicalis: NOT DETECTED
Carbapenem resistance: NOT DETECTED
ENTEROBACTER CLOACAE COMPLEX: NOT DETECTED
ENTEROBACTERIACEAE SPECIES: DETECTED — AB
ENTEROCOCCUS SPECIES: NOT DETECTED
ESCHERICHIA COLI: DETECTED — AB
Haemophilus influenzae: NOT DETECTED
KLEBSIELLA OXYTOCA: NOT DETECTED
KLEBSIELLA PNEUMONIAE: NOT DETECTED
LISTERIA MONOCYTOGENES: NOT DETECTED
Neisseria meningitidis: NOT DETECTED
PSEUDOMONAS AERUGINOSA: NOT DETECTED
Proteus species: NOT DETECTED
STAPHYLOCOCCUS AUREUS BCID: NOT DETECTED
STREPTOCOCCUS PNEUMONIAE: NOT DETECTED
STREPTOCOCCUS PYOGENES: NOT DETECTED
Serratia marcescens: NOT DETECTED
Staphylococcus species: NOT DETECTED
Streptococcus agalactiae: NOT DETECTED
Streptococcus species: NOT DETECTED

## 2016-08-22 LAB — CBC WITH DIFFERENTIAL/PLATELET
BASOS ABS: 0 10*3/uL (ref 0.0–0.1)
BASOS PCT: 1 %
Eosinophils Absolute: 0 10*3/uL (ref 0.0–0.7)
Eosinophils Relative: 1 %
HCT: 22.5 % — ABNORMAL LOW (ref 36.0–46.0)
HEMOGLOBIN: 7.3 g/dL — AB (ref 12.0–15.0)
LYMPHS PCT: 24 %
Lymphs Abs: 1.1 10*3/uL (ref 0.7–4.0)
MCH: 27.8 pg (ref 26.0–34.0)
MCHC: 32.4 g/dL (ref 30.0–36.0)
MCV: 85.6 fL (ref 78.0–100.0)
Monocytes Absolute: 0.6 10*3/uL (ref 0.1–1.0)
Monocytes Relative: 12 %
NEUTROS ABS: 3 10*3/uL (ref 1.7–7.7)
Neutrophils Relative %: 62 %
Platelets: 16 10*3/uL — CL (ref 150–400)
RBC: 2.63 MIL/uL — ABNORMAL LOW (ref 3.87–5.11)
RDW: 15.2 % (ref 11.5–15.5)
WBC: 4.7 10*3/uL (ref 4.0–10.5)

## 2016-08-22 LAB — ABO/RH: ABO/RH(D): A POS

## 2016-08-22 LAB — TYPE AND SCREEN
ABO/RH(D): A POS
Antibody Screen: NEGATIVE

## 2016-08-22 LAB — CBG MONITORING, ED: Glucose-Capillary: 169 mg/dL — ABNORMAL HIGH (ref 65–99)

## 2016-08-22 MED ORDER — NOREPINEPHRINE BITARTRATE 1 MG/ML IV SOLN
0.0000 ug/min | Freq: Once | INTRAVENOUS | Status: AC
  Administered 2016-08-22: 15 ug/min via INTRAVENOUS
  Filled 2016-08-22: qty 4

## 2016-08-22 MED ORDER — STERILE WATER FOR INJECTION IJ SOLN
INTRAMUSCULAR | Status: AC
  Filled 2016-08-22: qty 10

## 2016-08-22 MED ORDER — LORAZEPAM 2 MG/ML IJ SOLN
1.0000 mg | Freq: Once | INTRAMUSCULAR | Status: AC
  Administered 2016-08-22: 1 mg via INTRAVENOUS

## 2016-08-22 MED ORDER — LORAZEPAM 2 MG/ML IJ SOLN
INTRAMUSCULAR | Status: AC
  Filled 2016-08-22: qty 1

## 2016-08-22 MED ORDER — ZIPRASIDONE MESYLATE 20 MG IM SOLR
10.0000 mg | Freq: Once | INTRAMUSCULAR | Status: AC
  Administered 2016-08-22: 10 mg via INTRAMUSCULAR
  Filled 2016-08-22: qty 20

## 2016-08-25 LAB — CULTURE, BLOOD (ROUTINE X 2)

## 2016-08-27 LAB — CULTURE, BLOOD (ROUTINE X 2): CULTURE: NO GROWTH

## 2016-09-19 NOTE — ED Notes (Signed)
Continues to be very anxious and agitated.  Given Ativan 1 mg IV.  Admitting physician at the bedside.  Agreed with medication and dosage.

## 2016-09-19 NOTE — ED Notes (Signed)
Post mortem care provided at this time.  Bed control aware of death.  Per [place] [facility] patient is okay to go to morgue.

## 2016-09-19 NOTE — ED Notes (Signed)
Per admitting physician all infusions stopped at this time.  Monitor placed on comfort profile.  Family at the bedside.

## 2016-09-19 NOTE — ED Notes (Signed)
Expired at this time.  Family at the bedside.

## 2016-09-19 NOTE — ED Notes (Signed)
Wasted with [facility] RN.

## 2016-09-19 NOTE — Progress Notes (Signed)
   09/15/2016 0000  Clinical Encounter Type  Visited With Patient and family together;Health care provider  Visit Type Initial;Follow-up;Psychological support;Spiritual support;Social support;ED  Referral From Nurse  Consult/Referral To Chaplain;Faith community  Spiritual Encounters  Spiritual Needs Emotional  Stress Factors  Patient Stress Factors None identified  Family Stress Factors None identified   Chaplain was called to give support to Pt ans the Pt's family. Chaplain offered emotional and spiritual support.

## 2016-09-19 DEATH — deceased
